# Patient Record
Sex: Female | Born: 1975 | Race: White | Hispanic: No | Marital: Married | Smoking: Never smoker
Health system: Southern US, Community
[De-identification: ages and names within clinical notes are randomized; demographics above are authoritative.]

## PROBLEM LIST (undated history)

## (undated) ENCOUNTER — Inpatient Hospital Stay (HOSPITAL_COMMUNITY): Payer: Self-pay

## (undated) DIAGNOSIS — D649 Anemia, unspecified: Secondary | ICD-10-CM

## (undated) DIAGNOSIS — Z8619 Personal history of other infectious and parasitic diseases: Secondary | ICD-10-CM

## (undated) DIAGNOSIS — Z87448 Personal history of other diseases of urinary system: Secondary | ICD-10-CM

## (undated) DIAGNOSIS — O09529 Supervision of elderly multigravida, unspecified trimester: Secondary | ICD-10-CM

## (undated) DIAGNOSIS — E119 Type 2 diabetes mellitus without complications: Secondary | ICD-10-CM

## (undated) HISTORY — DX: Type 2 diabetes mellitus without complications: E11.9

## (undated) HISTORY — DX: Supervision of elderly multigravida, unspecified trimester: O09.529

## (undated) HISTORY — PX: NO PAST SURGERIES: SHX2092

## (undated) HISTORY — DX: Personal history of other infectious and parasitic diseases: Z86.19

## (undated) HISTORY — DX: Personal history of other diseases of urinary system: Z87.448

## (undated) HISTORY — DX: Anemia, unspecified: D64.9

---

## 2000-10-05 ENCOUNTER — Emergency Department (HOSPITAL_COMMUNITY): Admission: EM | Admit: 2000-10-05 | Discharge: 2000-10-05 | Payer: Self-pay | Admitting: Emergency Medicine

## 2000-10-05 ENCOUNTER — Encounter: Payer: Self-pay | Admitting: Emergency Medicine

## 2000-12-29 ENCOUNTER — Other Ambulatory Visit: Admission: RE | Admit: 2000-12-29 | Discharge: 2000-12-29 | Payer: Self-pay | Admitting: Gynecology

## 2001-03-24 ENCOUNTER — Emergency Department (HOSPITAL_COMMUNITY): Admission: EM | Admit: 2001-03-24 | Discharge: 2001-03-24 | Payer: Self-pay | Admitting: Emergency Medicine

## 2001-09-15 ENCOUNTER — Encounter: Admission: RE | Admit: 2001-09-15 | Discharge: 2001-09-15 | Payer: Self-pay | Admitting: Gynecology

## 2001-10-18 ENCOUNTER — Inpatient Hospital Stay (HOSPITAL_COMMUNITY): Admission: AD | Admit: 2001-10-18 | Discharge: 2001-10-20 | Payer: Self-pay | Admitting: *Deleted

## 2001-11-29 ENCOUNTER — Other Ambulatory Visit: Admission: RE | Admit: 2001-11-29 | Discharge: 2001-11-29 | Payer: Self-pay | Admitting: Gynecology

## 2002-12-29 ENCOUNTER — Other Ambulatory Visit: Admission: RE | Admit: 2002-12-29 | Discharge: 2002-12-29 | Payer: Self-pay | Admitting: Gynecology

## 2004-01-01 ENCOUNTER — Other Ambulatory Visit: Admission: RE | Admit: 2004-01-01 | Discharge: 2004-01-01 | Payer: Self-pay | Admitting: Gynecology

## 2005-01-01 ENCOUNTER — Other Ambulatory Visit: Admission: RE | Admit: 2005-01-01 | Discharge: 2005-01-01 | Payer: Self-pay | Admitting: Gynecology

## 2006-03-09 ENCOUNTER — Other Ambulatory Visit: Admission: RE | Admit: 2006-03-09 | Discharge: 2006-03-09 | Payer: Self-pay | Admitting: Gynecology

## 2006-07-08 ENCOUNTER — Ambulatory Visit: Payer: Self-pay | Admitting: Cardiology

## 2006-07-08 LAB — CONVERTED CEMR LAB
ALT: 18 units/L (ref 0–40)
AST: 17 units/L (ref 0–37)
Albumin: 3.4 g/dL — ABNORMAL LOW (ref 3.5–5.2)
Alkaline Phosphatase: 52 units/L (ref 39–117)
BUN: 8 mg/dL (ref 6–23)
Basophils Absolute: 0.1 10*3/uL (ref 0.0–0.1)
Basophils Relative: 0.8 % (ref 0.0–1.0)
Bilirubin, Direct: 0.1 mg/dL (ref 0.0–0.3)
CO2: 28 meq/L (ref 19–32)
Calcium: 9 mg/dL (ref 8.4–10.5)
Chloride: 106 meq/L (ref 96–112)
Cholesterol: 262 mg/dL (ref 0–200)
Creatinine, Ser: 0.7 mg/dL (ref 0.4–1.2)
Direct LDL: 171.7 mg/dL
Eosinophils Absolute: 0.1 10*3/uL (ref 0.0–0.6)
Eosinophils Relative: 1 % (ref 0.0–5.0)
GFR calc Af Amer: 127 mL/min
GFR calc non Af Amer: 105 mL/min
Glucose, Bld: 98 mg/dL (ref 70–99)
HCT: 34.4 % — ABNORMAL LOW (ref 36.0–46.0)
HDL: 48.9 mg/dL (ref >39.0)
Hemoglobin: 12.2 g/dL (ref 12.0–15.0)
Lymphocytes Relative: 31.1 % (ref 12.0–46.0)
MCHC: 35.5 g/dL (ref 30.0–36.0)
MCV: 83.3 fL (ref 78.0–100.0)
Monocytes Absolute: 0.3 10*3/uL (ref 0.2–0.7)
Monocytes Relative: 4.5 % (ref 3.0–11.0)
Neutro Abs: 4.3 10*3/uL (ref 1.4–7.7)
Neutrophils Relative %: 62.6 % (ref 43.0–77.0)
Platelets: 372 10*3/uL (ref 150–400)
Potassium: 4 meq/L (ref 3.5–5.1)
RBC: 4.13 M/uL (ref 3.87–5.11)
RDW: 11.6 % (ref 11.5–14.6)
Sodium: 140 meq/L (ref 135–145)
TSH: 1.14 microintl units/mL (ref 0.35–5.50)
Total Bilirubin: 0.5 mg/dL (ref 0.3–1.2)
Total CHOL/HDL Ratio: 5.4
Total Protein: 6.7 g/dL (ref 6.0–8.3)
Triglycerides: 236 mg/dL (ref 0–149)
VLDL: 47 mg/dL — ABNORMAL HIGH (ref 0–40)
WBC: 7 10*3/uL (ref 4.5–10.5)

## 2007-03-11 ENCOUNTER — Other Ambulatory Visit: Admission: RE | Admit: 2007-03-11 | Discharge: 2007-03-11 | Payer: Self-pay | Admitting: Gynecology

## 2007-11-30 ENCOUNTER — Ambulatory Visit: Payer: Self-pay | Admitting: Gynecology

## 2008-03-13 ENCOUNTER — Ambulatory Visit: Payer: Self-pay | Admitting: Gynecology

## 2008-03-13 ENCOUNTER — Other Ambulatory Visit: Admission: RE | Admit: 2008-03-13 | Discharge: 2008-03-13 | Payer: Self-pay | Admitting: Gynecology

## 2009-05-08 ENCOUNTER — Other Ambulatory Visit: Admission: RE | Admit: 2009-05-08 | Discharge: 2009-05-08 | Payer: Self-pay | Admitting: Gynecology

## 2009-05-08 ENCOUNTER — Ambulatory Visit: Payer: Self-pay | Admitting: Gynecology

## 2009-12-03 ENCOUNTER — Ambulatory Visit: Payer: Self-pay | Admitting: Gynecology

## 2010-05-09 ENCOUNTER — Emergency Department (HOSPITAL_BASED_OUTPATIENT_CLINIC_OR_DEPARTMENT_OTHER)
Admission: EM | Admit: 2010-05-09 | Discharge: 2010-05-09 | Disposition: A | Discharge status: Home / Self Care | Payer: BC Managed Care – PPO | Attending: Emergency Medicine | Admitting: Emergency Medicine

## 2010-05-09 ENCOUNTER — Emergency Department (INDEPENDENT_AMBULATORY_CARE_PROVIDER_SITE_OTHER): Payer: BC Managed Care – PPO

## 2010-05-09 DIAGNOSIS — R079 Chest pain, unspecified: Secondary | ICD-10-CM

## 2010-05-09 DIAGNOSIS — R071 Chest pain on breathing: Secondary | ICD-10-CM | POA: Insufficient documentation

## 2010-05-09 DIAGNOSIS — E7800 Pure hypercholesterolemia, unspecified: Secondary | ICD-10-CM | POA: Insufficient documentation

## 2010-05-09 LAB — POCT CARDIAC MARKERS
CKMB, poc: <1 ng/mL — ABNORMAL LOW (ref 1.0–8.0)
Myoglobin, poc: 59.8 ng/mL (ref 12–200)
Troponin i, poc: <0.05 ng/mL (ref 0.00–0.09)

## 2010-07-16 ENCOUNTER — Other Ambulatory Visit: Payer: Self-pay | Admitting: Gynecology

## 2010-07-16 ENCOUNTER — Other Ambulatory Visit (HOSPITAL_COMMUNITY)
Admission: RE | Admit: 2010-07-16 | Discharge: 2010-07-16 | Disposition: A | Discharge status: Home / Self Care | Payer: BC Managed Care – PPO | Source: Ambulatory Visit | Attending: Gynecology | Admitting: Gynecology

## 2010-07-16 ENCOUNTER — Encounter (INDEPENDENT_AMBULATORY_CARE_PROVIDER_SITE_OTHER): Payer: BC Managed Care – PPO | Admitting: Gynecology

## 2010-07-16 DIAGNOSIS — R82998 Other abnormal findings in urine: Secondary | ICD-10-CM

## 2010-07-16 DIAGNOSIS — Z13220 Encounter for screening for lipoid disorders: Secondary | ICD-10-CM

## 2010-07-16 DIAGNOSIS — Z833 Family history of diabetes mellitus: Secondary | ICD-10-CM

## 2010-07-16 DIAGNOSIS — Z124 Encounter for screening for malignant neoplasm of cervix: Secondary | ICD-10-CM | POA: Insufficient documentation

## 2010-07-16 DIAGNOSIS — Z01419 Encounter for gynecological examination (general) (routine) without abnormal findings: Secondary | ICD-10-CM

## 2010-08-09 NOTE — Discharge Summary (Signed)
Ashley Lane, Ashley Lane                         ACCOUNT NO.:  [id number]   MEDICAL RECORD NO.:  [id number]                   PATIENT TYPE:  INP   LOCATION:  9121                                 FACILITY:  [facility]   PHYSICIAN:  Devin M. Ciliberti, M.D.            DATE OF BIRTH:  1975-07-07   DATE OF ADMISSION:  10/18/2001  DATE OF DISCHARGE:  10/20/2001                                 DISCHARGE SUMMARY   DISCHARGE DIAGNOSIS:  Intrauterine pregnancy at 39 weeks delivered, status  post vacuum-assisted vaginal delivery.   HISTORY:  This is a 35 year old female gravida 2 para 1 with an EDC of  October 25, 2001.  The patient had a history of macrosomia.   HOSPITAL COURSE:  On October 18, 2001 the patient was admitted at 39 weeks with  favorable cervical dilatation with a history of macrosomia.  Artificial  rupture of membranes was performed and the patient was begun on Pitocin.  M.D. was called to see the patient at 1745 because the patient, after she  had received her epidural catheter, suddenly had an episode when she passed  out, became unresponsive, and then slowly returned to responsiveness.  It  was not observed by the hospital staff but was observed by the patient's  mother and husband, who reported that her eyes seemed to roll back but she  had no gross tonic-clonic seizure activity.  She was evaluated by  anesthesia, nursing staff, as well as M.D., and was unsure of the exact  cause of this episode.  It was thought by the anesthesiologist not to be  epidural related, but he did take the fentanyl out of the epidural and  decrease the epidural dose.  Blood tests were ordered, they were all within  normal limits, and since initial episode happened the patient began to  gradually feel better.  It was doubted by M.D. that a stroke had occurred  since she had no focal findings on physical exam.  The patient was monitored  and symptoms resolved.  On October 18, 2001 at 2153, the patient underwent  a  vacuum-assisted vaginal delivery of a female with Apgars of 9 and 9, weight  8 pounds 10 ounces.  There was no episiotomy, no laceration.  Postpartum the  patient remained afebrile, voiding, in stable condition.  The symptoms she  experienced previously totally resolved.  The patient was discharged is  satisfactory condition on October 20, 2001.   INSTRUCTIONS:  Given [facility] Gynecology instructions and postpartum  booklet.   ACCESSORY LABORATORY DATA:  The patient is A positive, rubella immune.  On  October 19, 2001 hemoglobin 9.1, hematocrit 26.6.   DISPOSITION:  The patient was discharged home.   FOLLOW-UP:  Informed to return to the office in six weeks; if any problems  prior to that time to be seen in the office.     Ashley Lane, P.A.  Devin M. Ciliberti, M.D.    TSG/MEDQ  D:  11/19/2001  T:  11/19/2001  Job:  [id number]

## 2010-08-09 NOTE — Assessment & Plan Note (Signed)
[facility]                            CARDIOLOGY OFFICE NOTE   KADINCE, BOXLEY                      MRN:          [id number]  DATE:07/08/2006                            DOB:          [birth year]    Mrs. Briones is a 35 year old female who was referred for evaluation of  hyperlipidemia.  She has no prior cardiac history.  She does not have  dyspnea on exertion, orthopnea, PND, pedal edema, palpitations, pre-  syncope, syncope, or exertional chest pain.  She recently had  laboratories checked on June 09, 2006.  Her total cholesterol was 298  with a triglyceride level of 224 and an LDL of 200.  Her HDL was 54.  Because of these findings, we were asked to further evaluate.  Note, she  has been taking fish oil since then.  She apparently does not have a  family history of hyperlipidemia by her report.   FAMILY HISTORY:  As above.  No history of hyperlipidemia.  There is no  history of coronary disease.   SOCIAL HISTORY:  She denies any tobacco or alcohol use, and there is no  drug use.   PAST MEDICAL HISTORY:  There is no diabetes mellitus or hypertension,  but she has been diagnosed with hyperlipidemia, as described in the HPI.  There are no previous thyroid problems.  She has not had any prior  cardiac issues, and there are no previous surgeries.   REVIEW OF SYSTEMS:  She denies any headaches, fevers, or chills.  There  is no productive cough or hemoptysis.  There was no recent weight loss  or gain.  There are no night sweats.  She denies any dysphagia,  odynophagia, melena, or hematochezia.  There is no dysuria or hematuria.  There is no rash or seizure activity.  There is no orthopnea, PND, or  pedal edema.  There is no claudication noted.  The remaining systems are  negative.   PHYSICAL EXAM:  Blood pressure 101/79, pulse is 74.  She weighs 218  pounds.  She is well-developed and somewhat obese.  She is in no acute distress.  SKIN:  Warm  and dry.  She does not appear to be depressed.  There is no peripheral clubbing.  BACK:  Normal.  HEENT:  Unremarkable.  Normal eyelids.  NECK:  Supple with a normal upstroke bilaterally, and I cannot  appreciate bruits.  There is no jugular venous distension and no  thyromegaly is noted.  CHEST:  Clear to auscultation with normal expansion.  CARDIOVASCULAR:  Regular rate and rhythm with normal S1, S2.  There are  no murmurs, rubs, or gallops noted.  I cannot palpate her PMI.  ABDOMEN:  Nontender, nondistended.  Positive bowel sounds.  No  hepatosplenomegaly.  No mass appreciated.  There is no abdominal bruit.  She has 2+ femoral pulses bilaterally.  No bruits.  EXTREMITIES:  No edema.  I can palpate no cords.  She has 2+ posterior  tibial pulses bilaterally.  NEUROLOGIC:  Grossly intact.   ELECTROCARDIOGRAM:  Shows a sinus rhythm at a rate of 74.  There  are no  ST changes noted.   DIAGNOSES:  Hyperlipidemia - The patient presents for evaluation of  hyperlipidemia.  We obtained her cholesterol results and her LDL is 200.  She has no other risk factors, including no family history, no diabetes  mellitus, and no hypertension or tobacco use.  However, given the  severity of elevation, this will most likely require Statin therapy.  We  discussed this and she is somewhat hesitant to consider medications at  this point.  Instead, we will check laboratories, since she has been on  fish oil for 1 month.  We will repeat a fasting lipid panel, and we will  also check a TSH to make sure that hypothyroidism is not contributing to  her hyperlipidemia.  We will also check liver function and a CBC.  We  will have her return in approximately 4 to 6 weeks.  I think if her LDL  remains in the 200 range, she would benefit from a Statin long term and  we will discuss this issue.  She also will need to be educated on diet.  She will continue on her multivitamin  and omega 3 fish oil for now, but  I  have asked her to discontinue her garlic.     Madolyn Frieze Jens Som, MD, [facility]  Electronically Signed    BSC/MedQ  DD: 07/08/2006  DT: 07/08/2006  Job #: [id number]   cc:   Marcial Pacas P. Fontaine, M.D.

## 2010-08-09 NOTE — Consult Note (Signed)
NAMEPERLITA, Ashley Lane                         ACCOUNT NO.:  [id number]   MEDICAL RECORD NO.:  [id number]                   PATIENT TYPE:  INP   LOCATION:  9121                                 FACILITY:  [facility]   PHYSICIAN:  Timothy P. Fontaine, M.D.           DATE OF BIRTH:  10/26/1975   DATE OF CONSULTATION:  DATE OF DISCHARGE:                                OB/GYN CONSULTATION   PROGRESS NOTE:  Called to see patient.  Had received her epidural catheter and subsequently  had an episode where she passed out, became unresponsive, and then slowly  has returned as far as responsiveness is concerned.  This was unobserved by  hospital staff but was observed by the patient's mother and husband.  They  reported that her eyes seemed to roll back in her head, but she had no gross  tonic-clonic or other seizure-like activity.  The short evaluation  subsequently by nursing and then subsequent to that by anesthesia showed the  patient was arousable although somewhat somnolent.  Her blood pressure had  been normal throughout all of this.  She did not seem to have a major  hypotensive episode associated with this.  The fetal tracing remained  reassuring throughout, and her contraction pattern had remained unchanged,  contracting every several minutes with  low adequate pattern.   Upon my notification, I subsequently came to evaluate the patient.  She was  awake; she was oriented.  She did report feeling very drained or tired.  She  denied any visual changes.  She was able to read.  No nausea or vomiting, no  headaches or other symptomatology.  Her physical exam with a rough  neurologic exam to include eyes,  extraocular muscles intact.  Pupils were  reactive to accommodation and light.  Tongue was midline and forward.  She  shoulder shrug was symmetrical.  Upper muscle movement was intact.  Upper  muscle sensation was intact.  Her reflexes were 1+, although she did have  decreased muscle strength  in finger grip bilaterally which was symmetrical.  Her lower extremity exam as hampered by her epidural in that she had limited  movement of both legs following epidural placement.  She did have some rough  sensation to scratching over both lower extremities but was unable to move  them grossly.  She could wiggle both toes and had depressed reflexes without  clonus in both lower extremities.  Her cervical exam revealed cervix to be 6  cm, -1 to -2, 80% ,and her contraction pattern was low adequate with  contractions every 2 to 3 minutes with a reactive fetal tracing.   Discussed with the patient and her family somewhat of a confusing picture.  The anesthesiologist who saw her initially did not feel that this was  epidural related.  He did take the fentanyl out of the epidural, and he did  decrease the epidural dose.  The patient notes since  the admission episode  she is feeling better.  She was numb up to her xiphoid by her history, and  this seems to be resolving where now she can feel her upper stomach.  Again,  she is feeling better and stronger as time goes on.  I did order a  comprehensive metabolic panel to check electrolytes, glucose, as well as a  CBC for platelets and a PT/PTT.  Discussed various differentials to include  epidural related, although again the anesthesiologist does not feel this, as  well as after placement of the epidural whether or not she had significant  pain relief, and this led to a heavy sleep scenario, although again this  does not quite fit the picture.  The patient did not have any gross seizure  activity, has no history of seizures in the past, and the question is if  this would be some seizure variant.  I doubt a more ominous neurologic such  as stroke as she has no focal findings at all and no other symptoms such as  hypertension or hypotension.   Will plan on the blood work first.  Will monitor her symptomatology, if with  decreasing epidural load,  these symptoms slowly resolve,and will follow  expectantly.  If there is any question, will have neurologist see her up to  and including CNS study per their recommendation.  The patient and family  are comfortable with this plan.                                               Timothy P. Audie Box, M.D.    TPF/MEDQ  D:  10/18/2001  T:  10/23/2001  Job:  [id number]

## 2011-07-21 ENCOUNTER — Encounter: Payer: Self-pay | Admitting: Gynecology

## 2011-07-21 ENCOUNTER — Ambulatory Visit (INDEPENDENT_AMBULATORY_CARE_PROVIDER_SITE_OTHER): Payer: BC Managed Care – PPO | Admitting: Gynecology

## 2011-07-21 ENCOUNTER — Encounter: Payer: Self-pay | Admitting: *Deleted

## 2011-07-21 DIAGNOSIS — N912 Amenorrhea, unspecified: Secondary | ICD-10-CM

## 2011-07-21 NOTE — Patient Instructions (Signed)
Follow up for ultrasound. 

## 2011-07-21 NOTE — Progress Notes (Signed)
Patient presents with LMP 06/18/2011. Had been using rhythm method for contraception. UPT here is positive.  Exam with Amy chaperone present Abdomen soft nontender without masses guarding rebound organomegaly. Pelvic external BUS vagina normal. Cervix normal. Uterus soft mildly enlarged midline mobile nontender. Adnexa without masses or tenderness.  Assessment and plan: Pregnancy at 4 weeks-5 weeks with [facility] 03/23/2012. She is having some mild pelvic cramping which I think is normal but we'll go ahead and get baseline ultrasound. Ectopic precautions reviewed. She understands we do not do obstetrics and she will make an appointment to see an obstetrician in town over the next 4 weeks. She Will start on a prenatal vitamin. She has been on a multivitamin with folic acid.

## 2011-07-28 ENCOUNTER — Ambulatory Visit (INDEPENDENT_AMBULATORY_CARE_PROVIDER_SITE_OTHER): Payer: BC Managed Care – PPO | Admitting: Gynecology

## 2011-07-28 ENCOUNTER — Ambulatory Visit (INDEPENDENT_AMBULATORY_CARE_PROVIDER_SITE_OTHER): Payer: BC Managed Care – PPO

## 2011-07-28 ENCOUNTER — Encounter: Payer: Self-pay | Admitting: Gynecology

## 2011-07-28 DIAGNOSIS — N912 Amenorrhea, unspecified: Secondary | ICD-10-CM

## 2011-07-28 LAB — US OB TRANSVAGINAL

## 2011-07-28 NOTE — Progress Notes (Signed)
Patient follows up for ultrasound which confirms a viable IUP at 5 weeks 6 days consistent with her LMP. Reviewed this with her and her husband. She is in the process of arranging follow up with an obstetrician in town. I gave her a copy of the ultrasound report. She is on a prenatal vitamin and without issues.

## 2011-07-28 NOTE — Patient Instructions (Signed)
Make an appointment over the next 4 weeks to see an obstetrician for continued obstetrical care.

## 2011-08-04 ENCOUNTER — Telehealth: Payer: Self-pay | Admitting: *Deleted

## 2011-08-04 NOTE — Telephone Encounter (Signed)
Pt informed with the below note. 

## 2011-08-04 NOTE — Telephone Encounter (Signed)
Left the below note on pt voicemail. 

## 2011-08-04 NOTE — Telephone Encounter (Signed)
Pt was seen in office for ultrasound which confirms a viable IUP at 5 weeks 6 days. Pt received copy of ultrasound and stated it said that she has a cyst on her right ovary and that her right ovary in larger than her left ovary. Pt said she didn't know of this information and is concerned? Please advise

## 2011-08-04 NOTE — Telephone Encounter (Signed)
That is the corpus luteal cyst which is where she ovulated from and became pregnant from him. It's normal to have a cyst on one ovary that is supporting the early pregnancy.

## 2011-08-19 LAB — OB RESULTS CONSOLE RUBELLA ANTIBODY, IGM: Rubella: IMMUNE

## 2011-08-19 LAB — OB RESULTS CONSOLE ABO/RH: RH Type: POSITIVE

## 2011-08-19 LAB — OB RESULTS CONSOLE HEPATITIS B SURFACE ANTIGEN: Hepatitis B Surface Ag: NEGATIVE

## 2012-02-02 ENCOUNTER — Observation Stay (HOSPITAL_COMMUNITY)
Admission: AD | Admit: 2012-02-02 | Discharge: 2012-02-04 | Disposition: A | Discharge status: Home / Self Care | Payer: BC Managed Care – PPO | Source: Ambulatory Visit | Attending: Obstetrics and Gynecology | Admitting: Obstetrics and Gynecology

## 2012-02-02 ENCOUNTER — Encounter (HOSPITAL_COMMUNITY): Payer: Self-pay | Admitting: *Deleted

## 2012-02-02 DIAGNOSIS — O4590 Premature separation of placenta, unspecified, unspecified trimester: Principal | ICD-10-CM | POA: Insufficient documentation

## 2012-02-02 DIAGNOSIS — O4690 Antepartum hemorrhage, unspecified, unspecified trimester: Secondary | ICD-10-CM

## 2012-02-02 LAB — URINE MICROSCOPIC-ADD ON

## 2012-02-02 LAB — URINALYSIS, ROUTINE W REFLEX MICROSCOPIC
Glucose, UA: 250 mg/dL — AB
pH: 6.5 (ref 5.0–8.0)

## 2012-02-02 LAB — AMNISURE RUPTURE OF MEMBRANE (ROM) NOT AT ARMC: Amnisure ROM: NEGATIVE

## 2012-02-02 NOTE — MAU Provider Note (Signed)
  History     CSN: [id number]  Arrival date and time: 02/02/12 2101   First Provider Initiated Contact with Patient 02/02/12 2220      Chief Complaint  Patient presents with  . Vaginal Bleeding   HPI  Ashley Lane is a 36 y.o. G3P2. She presents today with vaginal bleeding. She last had intercourse 48 hours ago. Today at dinner she felt a small trickle and noticed it was bright red blood. She continued to have small drops of blood. It has slowed since coming here. +FM. She states she has been having BH contractions off and on today.  Past Medical History  Diagnosis Date  . Gestational diabetes     Past Surgical History  Procedure Date  . No past surgeries     Family History  Problem Relation Age of Onset  . Hypertension Mother   . Cancer Sister     cervical  . Hypertension Maternal Grandmother   . Hypertension Maternal Grandfather     History  Substance Use Topics  . Smoking status: Never Smoker   . Smokeless tobacco: Never Used  . Alcohol Use: No    Allergies:  Allergies  Allergen Reactions  . Latex     Prescriptions prior to admission  Medication Sig Dispense Refill  . EVENING PRIMROSE OIL PO Take by mouth.      . fish oil-omega-3 fatty acids 1000 MG capsule Take 2 g by mouth daily.      . Multiple Vitamin (MULTIVITAMIN) tablet Take 1 tablet by mouth daily.        Review of Systems  Constitutional: Negative for fever.  Eyes: Negative for blurred vision.  Respiratory: Negative for shortness of breath.   Gastrointestinal: Negative for nausea, vomiting, diarrhea and constipation.  Genitourinary: Negative for dysuria, urgency and frequency.  Neurological: Negative for headaches.   Physical Exam   Blood pressure 123/71, pulse 100, temperature 98.2 F (36.8 C), temperature source Oral, resp. rate 18, last menstrual period 06/18/2011.  Physical Exam  Nursing note and vitals reviewed. Constitutional: She appears well-developed and  well-nourished.  Cardiovascular: Normal rate.   Respiratory: Effort normal.  GI: Soft. There is no tenderness. There is no rebound and no guarding.  Genitourinary:        External: normal Vagina: pooling of blood in vaginal vault Cervical exam deferred pending amnisure Uterus: AGA  Skin: Skin is warm and dry.  Psychiatric: She has a normal mood and affect.   Cervix: 1/50/-3  MAU Course  Procedures  2230: Spoke with Dr. Renaldo Fiddler, plan to admit patient overnight.   Assessment and Plan  Third trimester bleeding Admit to antenatal Care turned over to Dr. Renaldo Fiddler.   Tawnya Crook 02/02/2012, 10:37 PM

## 2012-02-02 NOTE — MAU Note (Signed)
Out to eat tonight noticed sharp pain in vaginal area around 8:00. When patient arrived home, noticed dime sized area in underwear. With wiping noticed red blood on toilet tissue.  Patient also describes increased pressure that feels "like I am going to wet myself".

## 2012-02-03 ENCOUNTER — Inpatient Hospital Stay (HOSPITAL_COMMUNITY): Payer: BC Managed Care – PPO

## 2012-02-03 ENCOUNTER — Encounter (HOSPITAL_COMMUNITY): Payer: Self-pay | Admitting: *Deleted

## 2012-02-03 LAB — CBC
MCH: 30.1 pg (ref 26.0–34.0)
MCHC: 34.1 g/dL (ref 30.0–36.0)
Platelets: 224 10*3/uL (ref 150–400)
RDW: 13.8 % (ref 11.5–15.5)

## 2012-02-03 LAB — ABO/RH: ABO/RH(D): A POS

## 2012-02-03 LAB — RPR: RPR Ser Ql: NONREACTIVE

## 2012-02-03 LAB — TYPE AND SCREEN: ABO/RH(D): A POS

## 2012-02-03 LAB — OB RESULTS CONSOLE ABO/RH

## 2012-02-03 MED ORDER — ZOLPIDEM TARTRATE 5 MG PO TABS: 5.0000 mg | ORAL_TABLET | Freq: Every evening | ORAL | Status: DC | PRN

## 2012-02-03 MED ORDER — SODIUM CHLORIDE 0.9 % IJ SOLN
3.0000 mL | Freq: Two times a day (BID) | INTRAMUSCULAR | Status: DC
  Administered 2012-02-03 (×2): 3 mL via INTRAVENOUS

## 2012-02-03 MED ORDER — PRENATAL MULTIVITAMIN CH
1.0000 | ORAL_TABLET | Freq: Every day | ORAL | Status: DC
  Administered 2012-02-03: 1 via ORAL
  Filled 2012-02-03: qty 1

## 2012-02-03 MED ORDER — ACETAMINOPHEN 325 MG PO TABS: 650.0000 mg | ORAL_TABLET | ORAL | Status: DC | PRN

## 2012-02-03 MED ORDER — DOCUSATE SODIUM 100 MG PO CAPS
100.0000 mg | ORAL_CAPSULE | Freq: Every day | ORAL | Status: DC
Start: 2012-02-03 — End: 2012-02-04
  Administered 2012-02-03: 100 mg via ORAL
  Filled 2012-02-03: qty 1

## 2012-02-03 MED ORDER — CALCIUM CARBONATE ANTACID 500 MG PO CHEW: 2.0000 | CHEWABLE_TABLET | ORAL | Status: DC | PRN

## 2012-02-03 MED ORDER — BETAMETHASONE SOD PHOS & ACET 6 (3-3) MG/ML IJ SUSP
12.0000 mg | Freq: Every day | INTRAMUSCULAR | Status: AC
  Administered 2012-02-03 – 2012-02-04 (×2): 12 mg via INTRAMUSCULAR
  Filled 2012-02-03 (×2): qty 2

## 2012-02-03 MED ORDER — SODIUM CHLORIDE 0.9 % IJ SOLN
INTRAMUSCULAR | Status: AC
  Filled 2012-02-03: qty 3

## 2012-02-03 NOTE — Progress Notes (Signed)
UR Chart review completed.  

## 2012-02-03 NOTE — Progress Notes (Signed)
Light brown discharge with wiping today No abdominal pain, HA, epigastric pain, trauma  VSS Afeb Uterus soft, NT FHT reactive UCs none  U/S Breech, EFW 5# 12oz, normal AFI, no previa or U/S evidence of abruption  A: Painless vaginal Bleeding-possible marginal abruption  P: Observation      Fetal Monitors      Betamethasone

## 2012-02-03 NOTE — H&P (Signed)
Chief Complaint   Patient presents with   .  Vaginal Bleeding    HPI  Ashley Lane is a 36 y.o. G3P2. She presents today with vaginal bleeding. She last had intercourse 48 hours ago. Today at dinner she felt a small trickle and noticed it was bright red blood. She continued to have small drops of blood. It has slowed since coming here. +FM. She states she has been having BH contractions off and on today.  Past Medical History   Diagnosis  Date   .  Gestational diabetes     Past Surgical History   Procedure  Date   .  No past surgeries     Family History   Problem  Relation  Age of Onset   .  Hypertension  Mother    .  Cancer  Sister       cervical    .  Hypertension  Maternal Grandmother    .  Hypertension  Maternal Grandfather     History   Substance Use Topics   .  Smoking status:  Never Smoker   .  Smokeless tobacco:  Never Used   .  Alcohol Use:  No    Allergies:  Allergies   Allergen  Reactions   .  Latex     Prescriptions prior to admission   Medication  Sig  Dispense  Refill   .  EVENING PRIMROSE OIL PO  Take by mouth.     .  fish oil-omega-3 fatty acids 1000 MG capsule  Take 2 g by mouth daily.     .  Multiple Vitamin (MULTIVITAMIN) tablet  Take 1 tablet by mouth daily.      Review of Systems  Constitutional: Negative for fever.  Eyes: Negative for blurred vision.  Respiratory: Negative for shortness of breath.  Gastrointestinal: Negative for nausea, vomiting, diarrhea and constipation.  Genitourinary: Negative for dysuria, urgency and frequency.  Neurological: Negative for headaches.   Physical Exam   Blood pressure 123/71, pulse 100, temperature 98.2 F (36.8 C), temperature source Oral, resp. rate 18, last menstrual period 06/18/2011.  Physical Exam  Nursing note and vitals reviewed.  Constitutional: She appears well-developed and well-nourished.  Cardiovascular: Normal rate.  Respiratory: Effort normal.  GI: Soft. There is no tenderness.  There is no rebound and no guarding.  Genitourinary:   External: normal Vagina: pooling of blood in vaginal vault Cervical exam deferred pending amnisure Uterus: AGA  Skin: Skin is warm and dry.  Psychiatric: She has a normal mood and affect.   Cervix: 1/50/-3  MAU Course   Procedures  2230: Spoke with Dr. Renaldo Fiddler, plan to admit patient overnight.  Assessment and Plan    Third trimester bleeding Admit CBC Ultrasound

## 2012-02-03 NOTE — MAU Note (Signed)
TO US VIA W/C.

## 2012-02-04 NOTE — Progress Notes (Signed)
Patient has not had any bleeding since admission. Reports good fetal movement. Denies contractions. Afebrile Vital signs stable General alert and oriented Abdomen is soft and non tender Fetal heart rate is reactive  IMPRESSION: IUP at 33 w 4 days Possible small marginal abruption  PLAN: Patient is very clinically stable Discharge home  Modified bedrest  Strict precautions

## 2012-02-04 NOTE — Discharge Summary (Signed)
Admission Diagnosis: IUP at 33 weeks Vaginal bleeding  Discharge diagnosis: Same Possible marginal placental abruption  Hospital course: 36 year old female admitted with painless vaginal bleeding. She was placed in observation. During her hospital stay, she did not experience any vaginal bleeding. She was given steroid series. Ultrasound was completely normal. She was discharged home in good condition. She will be on modified bedrest. She will return to office next Monday for office visit and ultrasound.

## 2012-03-04 LAB — OB RESULTS CONSOLE GBS: GBS: NEGATIVE

## 2012-03-18 ENCOUNTER — Encounter (HOSPITAL_COMMUNITY): Payer: Self-pay | Admitting: *Deleted

## 2012-03-18 ENCOUNTER — Telehealth (HOSPITAL_COMMUNITY): Payer: Self-pay | Admitting: *Deleted

## 2012-03-18 NOTE — Telephone Encounter (Signed)
Preadmission screen  

## 2012-03-19 ENCOUNTER — Encounter (HOSPITAL_COMMUNITY): Payer: Self-pay

## 2012-03-19 ENCOUNTER — Inpatient Hospital Stay (HOSPITAL_COMMUNITY): Payer: BC Managed Care – PPO | Admitting: Anesthesiology

## 2012-03-19 ENCOUNTER — Inpatient Hospital Stay (HOSPITAL_COMMUNITY)
Admission: RE | Admit: 2012-03-19 | Discharge: 2012-03-21 | DRG: 372 | Disposition: A | Discharge status: Home / Self Care | Payer: BC Managed Care – PPO | Source: Ambulatory Visit | Attending: Obstetrics & Gynecology | Admitting: Obstetrics & Gynecology

## 2012-03-19 ENCOUNTER — Encounter (HOSPITAL_COMMUNITY): Payer: Self-pay | Admitting: Anesthesiology

## 2012-03-19 DIAGNOSIS — O09529 Supervision of elderly multigravida, unspecified trimester: Secondary | ICD-10-CM | POA: Diagnosis present

## 2012-03-19 DIAGNOSIS — O99814 Abnormal glucose complicating childbirth: Principal | ICD-10-CM | POA: Diagnosis present

## 2012-03-19 LAB — CBC
HCT: 32.1 % — ABNORMAL LOW (ref 36.0–46.0)
Hemoglobin: 10.9 g/dL — ABNORMAL LOW (ref 12.0–15.0)
MCH: 29.6 pg (ref 26.0–34.0)
RBC: 3.68 MIL/uL — ABNORMAL LOW (ref 3.87–5.11)

## 2012-03-19 LAB — GLUCOSE, CAPILLARY
Glucose-Capillary: 125 mg/dL — ABNORMAL HIGH (ref 70–99)
Glucose-Capillary: 80 mg/dL (ref 70–99)

## 2012-03-19 LAB — TYPE AND SCREEN: ABO/RH(D): A POS

## 2012-03-19 LAB — RPR: RPR Ser Ql: NONREACTIVE

## 2012-03-19 MED ORDER — OXYCODONE-ACETAMINOPHEN 5-325 MG PO TABS: 1.0000 | ORAL_TABLET | ORAL | Status: DC | PRN

## 2012-03-19 MED ORDER — EPHEDRINE 5 MG/ML INJ
10.0000 mg | INTRAVENOUS | Status: DC | PRN
  Filled 2012-03-19 (×2): qty 4

## 2012-03-19 MED ORDER — ONDANSETRON HCL 4 MG/2ML IJ SOLN: 4.0000 mg | Freq: Four times a day (QID) | INTRAMUSCULAR | Status: DC | PRN

## 2012-03-19 MED ORDER — ZOLPIDEM TARTRATE 5 MG PO TABS: 5.0000 mg | ORAL_TABLET | Freq: Every evening | ORAL | Status: DC | PRN

## 2012-03-19 MED ORDER — ACETAMINOPHEN 325 MG PO TABS: 650.0000 mg | ORAL_TABLET | ORAL | Status: DC | PRN

## 2012-03-19 MED ORDER — ONDANSETRON HCL 4 MG/2ML IJ SOLN: 4.0000 mg | INTRAMUSCULAR | Status: DC | PRN

## 2012-03-19 MED ORDER — PHENYLEPHRINE 40 MCG/ML (10ML) SYRINGE FOR IV PUSH (FOR BLOOD PRESSURE SUPPORT)
80.0000 ug | PREFILLED_SYRINGE | INTRAVENOUS | Status: DC | PRN
  Administered 2012-03-19: 120 ug via INTRAVENOUS
  Administered 2012-03-19: 80 ug via INTRAVENOUS
  Filled 2012-03-19 (×2): qty 5

## 2012-03-19 MED ORDER — OXYTOCIN 40 UNITS IN LACTATED RINGERS INFUSION - SIMPLE MED
1.0000 m[IU]/min | INTRAVENOUS | Status: DC
  Administered 2012-03-19: 2 m[IU]/min via INTRAVENOUS
  Filled 2012-03-19: qty 1000

## 2012-03-19 MED ORDER — BENZOCAINE-MENTHOL 20-0.5 % EX AERO
1.0000 "application " | INHALATION_SPRAY | CUTANEOUS | Status: DC | PRN
  Administered 2012-03-19: 1 via TOPICAL
  Filled 2012-03-19: qty 56

## 2012-03-19 MED ORDER — IBUPROFEN 600 MG PO TABS
600.0000 mg | ORAL_TABLET | Freq: Four times a day (QID) | ORAL | Status: DC | PRN
  Administered 2012-03-19: 600 mg via ORAL
  Filled 2012-03-19: qty 1

## 2012-03-19 MED ORDER — OXYTOCIN BOLUS FROM INFUSION
500.0000 mL | INTRAVENOUS | Status: DC
  Administered 2012-03-19: 500 mL via INTRAVENOUS

## 2012-03-19 MED ORDER — PHENYLEPHRINE 40 MCG/ML (10ML) SYRINGE FOR IV PUSH (FOR BLOOD PRESSURE SUPPORT): 80.0000 ug | PREFILLED_SYRINGE | INTRAVENOUS | Status: DC | PRN

## 2012-03-19 MED ORDER — FENTANYL 2.5 MCG/ML BUPIVACAINE 1/10 % EPIDURAL INFUSION (WH - ANES)
14.0000 mL/h | INTRAMUSCULAR | Status: DC
  Administered 2012-03-19: 14 mL/h via EPIDURAL
  Filled 2012-03-19: qty 125

## 2012-03-19 MED ORDER — SODIUM BICARBONATE 8.4 % IV SOLN
INTRAVENOUS | Status: DC | PRN
  Administered 2012-03-19: 5 mL via EPIDURAL

## 2012-03-19 MED ORDER — TERBUTALINE SULFATE 1 MG/ML IJ SOLN: 0.2500 mg | Freq: Once | INTRAMUSCULAR | Status: DC | PRN

## 2012-03-19 MED ORDER — FLEET ENEMA 7-19 GM/118ML RE ENEM: 1.0000 | ENEMA | RECTAL | Status: DC | PRN

## 2012-03-19 MED ORDER — TETANUS-DIPHTH-ACELL PERTUSSIS 5-2.5-18.5 LF-MCG/0.5 IM SUSP: 0.5000 mL | Freq: Once | INTRAMUSCULAR | Status: DC

## 2012-03-19 MED ORDER — LIDOCAINE HCL (PF) 1 % IJ SOLN: 30.0000 mL | INTRAMUSCULAR | Status: DC | PRN

## 2012-03-19 MED ORDER — LACTATED RINGERS IV SOLN
500.0000 mL | Freq: Once | INTRAVENOUS | Status: AC
  Administered 2012-03-19: 500 mL via INTRAVENOUS

## 2012-03-19 MED ORDER — DIPHENHYDRAMINE HCL 25 MG PO CAPS: 25.0000 mg | ORAL_CAPSULE | Freq: Four times a day (QID) | ORAL | Status: DC | PRN

## 2012-03-19 MED ORDER — OXYTOCIN 40 UNITS IN LACTATED RINGERS INFUSION - SIMPLE MED: 62.5000 mL/h | INTRAVENOUS | Status: DC

## 2012-03-19 MED ORDER — SENNOSIDES-DOCUSATE SODIUM 8.6-50 MG PO TABS
2.0000 | ORAL_TABLET | Freq: Every day | ORAL | Status: DC
  Administered 2012-03-19: 2 via ORAL

## 2012-03-19 MED ORDER — DIBUCAINE 1 % RE OINT: 1.0000 "application " | TOPICAL_OINTMENT | RECTAL | Status: DC | PRN

## 2012-03-19 MED ORDER — PRENATAL MULTIVITAMIN CH
1.0000 | ORAL_TABLET | Freq: Every day | ORAL | Status: DC
  Administered 2012-03-20 – 2012-03-21 (×2): 1 via ORAL
  Filled 2012-03-19 (×2): qty 1

## 2012-03-19 MED ORDER — CITRIC ACID-SODIUM CITRATE 334-500 MG/5ML PO SOLN: 30.0000 mL | ORAL | Status: DC | PRN

## 2012-03-19 MED ORDER — SIMETHICONE 80 MG PO CHEW: 80.0000 mg | CHEWABLE_TABLET | ORAL | Status: DC | PRN

## 2012-03-19 MED ORDER — EPHEDRINE 5 MG/ML INJ: 10.0000 mg | INTRAVENOUS | Status: DC | PRN

## 2012-03-19 MED ORDER — LACTATED RINGERS IV SOLN
INTRAVENOUS | Status: DC
  Administered 2012-03-19: 1000 mL via INTRAVENOUS

## 2012-03-19 MED ORDER — ONDANSETRON HCL 4 MG PO TABS: 4.0000 mg | ORAL_TABLET | ORAL | Status: DC | PRN

## 2012-03-19 MED ORDER — IBUPROFEN 600 MG PO TABS
600.0000 mg | ORAL_TABLET | Freq: Four times a day (QID) | ORAL | Status: DC
  Administered 2012-03-19 – 2012-03-21 (×6): 600 mg via ORAL
  Filled 2012-03-19 (×5): qty 1

## 2012-03-19 MED ORDER — WITCH HAZEL-GLYCERIN EX PADS: 1.0000 "application " | MEDICATED_PAD | CUTANEOUS | Status: DC | PRN

## 2012-03-19 MED ORDER — DIPHENHYDRAMINE HCL 50 MG/ML IJ SOLN: 12.5000 mg | INTRAMUSCULAR | Status: DC | PRN

## 2012-03-19 MED ORDER — LANOLIN HYDROUS EX OINT: TOPICAL_OINTMENT | CUTANEOUS | Status: DC | PRN

## 2012-03-19 MED ORDER — LACTATED RINGERS IV SOLN
500.0000 mL | INTRAVENOUS | Status: DC | PRN
  Administered 2012-03-19: 500 mL via INTRAVENOUS

## 2012-03-19 NOTE — Anesthesia Procedure Notes (Signed)

## 2012-03-19 NOTE — Op Note (Signed)
SVD of VFI; APGARs 9,9, cord pH pending. Head delivered ROA followed atraumatically by body.  Mouth and nose bulb suctioned.  Cord clamped, cut, and baby to abdomen.  Cord pH obtained.  Placenta delivered S/I/3VC.  Fundus firmed with pitocin and massage.  Perineum without laceration.  Mom and baby stable.    Mitchel Honour, DO

## 2012-03-19 NOTE — H&P (Signed)
Ashley Lane is a 36 y.o. female presenting for induction of labor with A1DM. Maternal Medical History:  Contractions: Onset was more than 2 days ago.   Frequency: irregular.   Perceived severity is mild.    Fetal activity: Perceived fetal activity is normal.   Last perceived fetal movement was within the past hour.    Prenatal complications: no prenatal complications Prenatal Complications - Diabetes: gestational. Diabetes is managed by diet.      OB History    Grav Para Term Preterm Abortions TAB SAB Ect Mult Living   3 2 2  0 0 0 0 0 0 2     Past Medical History  Diagnosis Date  . H/O varicella   . AMA (advanced maternal age) multigravida 35+   . Hx of pyelonephritis   . Anemia     hx   Past Surgical History  Procedure Date  . No past surgeries    Family History: family history includes Cancer in her sister; Hypertension in her maternal grandfather, maternal grandmother, and mother; and Thyroid disease in her maternal grandmother and mother. Social History:  reports that she has never smoked. She has never used smokeless tobacco. She reports that she does not drink alcohol or use illicit drugs.   Prenatal Transfer Tool  Maternal Diabetes: Yes:  Diabetes Type:  Diet controlled Genetic Screening: Normal Maternal Ultrasounds/Referrals: Normal Fetal Ultrasounds or other Referrals:  None Maternal Substance Abuse:  No Significant Maternal Medications:  None Significant Maternal Lab Results:  None Other Comments:  None  ROS  Dilation: 4 Effacement (%): 30 Station: -3 Exam by:: Dr Langston Masker Blood pressure 125/73, pulse 89, last menstrual period 06/18/2011. Maternal Exam:  Uterine Assessment: Contraction strength is mild.  Contraction frequency is rare.   Abdomen: Patient reports no abdominal tenderness. Fundal height is c/w dates.   Estimated fetal weight is 7#12.   Fetal presentation: vertex  Introitus: Normal vulva. Normal vagina.  Ferning test: not done.    Nitrazine test: not done. Amniotic fluid character: not assessed.  Pelvis: adequate for delivery.   Cervix: Cervix evaluated by digital exam.     Physical Exam  Constitutional: She is oriented to person, place, and time. She appears well-developed and well-nourished.  HENT:  Head: Normocephalic and atraumatic.  Neck: Normal range of motion. Neck supple.  GI: Soft.  Genitourinary: Vagina normal and uterus normal.  Musculoskeletal: Normal range of motion.  Neurological: She is alert and oriented to person, place, and time.  Skin: Skin is warm and dry.  Psychiatric: She has a normal mood and affect. Her behavior is normal.    Prenatal labs: ABO, Rh: --/--/A POS, A POS (11/12 0032) Antibody: NEG (11/12 0032) Rubella: Immune (05/28 0000) RPR: NON REACTIVE (11/12 0032)  HBsAg: Negative (05/28 0000)  HIV: Non-reactive (05/28 0000)  GBS: Negative (12/12 0000)   Assessment/Plan: 36yo G3P2002 at [redacted]w[redacted]d for IOL -Counseled for pitocin; will start -GBS neg -A1DM: will check fsbs q 2 hours   Mabeline Varas 03/19/2012, 7:52 AM

## 2012-03-19 NOTE — Progress Notes (Signed)
Leetta Jailah Willis is a 36 y.o. G3P2002 at [redacted]w[redacted]d by ultrasound admitted for induction of labor due to Gestational diabetes.  Subjective: Feeling crampy lower abdominal pain.    Objective: BP 115/74  Pulse 85  Temp 98.5 F (36.9 C) (Oral)  Resp 20  Ht 5\' 6"  (1.676 m)  Wt 240 lb (108.863 kg)  BMI 38.74 kg/m2  LMP 06/18/2011      FHT:  FHR: 140 bpm, variability: moderate,  accelerations:  Present,  decelerations:  Absent UC:   regular, every 3-4 minutes SVE:   Dilation: 4 Effacement (%): 30 Station: -3 Exam by:: Dr Langston Masker AROM with copious clear fluid  Labs: Lab Results  Component Value Date   WBC 9.2 03/19/2012   HGB 10.9* 03/19/2012   HCT 32.1* 03/19/2012   MCV 87.2 03/19/2012   PLT 223 03/19/2012    Assessment / Plan: Induction of labor due to gestational diabetes,  progressing well on pitocin  Labor: Progressing normally Preeclampsia:  n/a Fetal Wellbeing:  Category I Pain Control:  Labor support without medications I/D:  n/a Anticipated MOD:  NSVD  Rodolfo Gaster 03/19/2012, 9:50 AM

## 2012-03-19 NOTE — Anesthesia Preprocedure Evaluation (Signed)

## 2012-03-20 LAB — CBC
MCH: 29.8 pg (ref 26.0–34.0)
MCV: 88.3 fL (ref 78.0–100.0)
Platelets: 170 10*3/uL (ref 150–400)
RDW: 13.7 % (ref 11.5–15.5)
WBC: 10.4 10*3/uL (ref 4.0–10.5)

## 2012-03-20 NOTE — Anesthesia Postprocedure Evaluation (Signed)
  Anesthesia Post-op Note  Patient: Ashley Lane  Procedure(s) Performed: * No procedures listed *  Patient Location: PACU and Mother/Baby  Anesthesia Type:Epidural  Level of Consciousness: awake, alert  and oriented  Airway and Oxygen Therapy: Patient Spontanous Breathing  Post-op Pain: none  Post-op Assessment: Post-op Vital signs reviewed, Patient's Cardiovascular Status Stable, No headache, No backache, No residual numbness and No residual motor weakness  Post-op Vital Signs: Reviewed and stable  Complications: No apparent anesthesia complications  Anesthesia Post-op Note  Patient: Ashley Lane  Procedure(s) Performed: * No procedures listed *  Patient Location: PACU and Mother/Baby  Anesthesia Type:Epidural  Level of Consciousness: awake, alert  and oriented  Airway and Oxygen Therapy: Patient Spontanous Breathing  Post-op Pain:mild  Post-op Assessment: Post-op Vital signs reviewed, Patient's Cardiovascular Status Stable, No headache, No backache, No residual numbness and No residual motor weakness  Post-op Vital Signs: Reviewed and stable  Complications: No apparent anesthesia complications

## 2012-03-20 NOTE — Progress Notes (Signed)
Post Partum Day 1 Subjective: no complaints, up ad lib, voiding and tolerating PO  Objective: Blood pressure 97/60, pulse 80, temperature 97.7 F (36.5 C), temperature source Oral, resp. rate 20, height 5\' 6"  (1.676 m), weight 240 lb (108.863 kg), last menstrual period 06/18/2011, SpO2 100.00%, unknown if currently breastfeeding.  Physical Exam:  General: alert, cooperative and appears stated age Lochia: appropriate Uterine Fundus: firm Incision: n/a  DVT Evaluation: No evidence of DVT seen on physical exam. Negative Homan's sign. No cords or calf tenderness.   Basename 03/20/12 0555 03/19/12 0715  HGB 10.4* 10.9*  HCT 30.8* 32.1*    Assessment/Plan: Plan for discharge tomorrow   LOS: 1 day   Meelah Tallo 03/20/2012, 8:21 AM

## 2012-03-21 MED ORDER — IBUPROFEN 600 MG PO TABS: 600.0000 mg | ORAL_TABLET | Freq: Four times a day (QID) | ORAL | Status: DC

## 2012-03-21 MED ORDER — OXYCODONE-ACETAMINOPHEN 5-325 MG PO TABS: 1.0000 | ORAL_TABLET | ORAL | Status: DC | PRN

## 2012-03-21 NOTE — Discharge Summary (Signed)
Obstetric Discharge Summary Reason for Admission: induction of labor Prenatal Procedures: none Intrapartum Procedures: spontaneous vaginal delivery Postpartum Procedures: none Complications-Operative and Postpartum: none Hemoglobin  Date Value Range Status  03/20/2012 10.4* 12.0 - 15.0 g/dL Final     HCT  Date Value Range Status  03/20/2012 30.8* 36.0 - 46.0 % Final    Physical Exam:  General: alert, cooperative and appears stated age Lochia: appropriate Uterine Fundus: firm Incision: n/a DVT Evaluation: No evidence of DVT seen on physical exam. Negative Homan's sign. No cords or calf tenderness.  Discharge Diagnoses: Term Pregnancy-delivered  Discharge Information: Date: 03/21/2012 Activity: pelvic rest Diet: routine Medications: PNV, Ibuprofen and Percocet Condition: stable Instructions: refer to practice specific booklet Discharge to: home   Newborn Data: Live born female  Birth Weight: 8 lb 2.9 oz (3710 g) APGAR: 9, 9  Home with mother.  Madilyne Tadlock 03/21/2012, 8:03 AM

## 2012-03-21 NOTE — Progress Notes (Signed)
Post Partum Day 2 Subjective: no complaints, up ad lib, voiding and tolerating PO  Objective: Blood pressure 106/69, pulse 99, temperature 97.8 F (36.6 C), temperature source Oral, resp. rate 18, height 5\' 6"  (1.676 m), weight 240 lb (108.863 kg), last menstrual period 06/18/2011, SpO2 100.00%, unknown if currently breastfeeding.  Physical Exam:  General: alert, cooperative and appears stated age Lochia: appropriate Uterine Fundus: firm Incision: n/a DVT Evaluation: No evidence of DVT seen on physical exam. Negative Homan's sign. No cords or calf tenderness.   Basename 03/20/12 0555 03/19/12 0715  HGB 10.4* 10.9*  HCT 30.8* 32.1*    Assessment/Plan: Discharge home and Breastfeeding   LOS: 2 days   Ashley Lane 03/21/2012, 8:00 AM

## 2012-11-26 ENCOUNTER — Encounter: Payer: Self-pay | Admitting: Gynecology

## 2012-11-26 ENCOUNTER — Ambulatory Visit (INDEPENDENT_AMBULATORY_CARE_PROVIDER_SITE_OTHER): Payer: BC Managed Care – PPO | Admitting: Gynecology

## 2012-11-26 DIAGNOSIS — R102 Pelvic and perineal pain: Secondary | ICD-10-CM

## 2012-11-26 DIAGNOSIS — N920 Excessive and frequent menstruation with regular cycle: Secondary | ICD-10-CM

## 2012-11-26 DIAGNOSIS — N949 Unspecified condition associated with female genital organs and menstrual cycle: Secondary | ICD-10-CM

## 2012-11-26 LAB — CBC WITH DIFFERENTIAL/PLATELET
Basophils Absolute: 0.1 10*3/uL (ref 0.0–0.1)
Basophils Relative: 1 % (ref 0–1)
MCHC: 33.6 g/dL (ref 30.0–36.0)
Neutro Abs: 4.2 10*3/uL (ref 1.7–7.7)
Neutrophils Relative %: 58 % (ref 43–77)
RDW: 13.9 % (ref 11.5–15.5)

## 2012-11-26 NOTE — Patient Instructions (Addendum)
Follow up for sonohysterogram as scheduled. 

## 2012-11-26 NOTE — Progress Notes (Signed)
Patient presents complaining of pelvic pain and heavier menses relatively quickly in January and has been having monthly regular menses although they are heavy. No intermenstrual bleeding. Notes over the last month or so pelvic pain primarily right lower quadrant that comes and goes not associated necessarily with her menses and also now occurring with intercourse. No urinary symptoms, nausea vomiting diarrhea or constipation. Not using anything for birth control at this time and they are deciding what they want to do from a contraceptive standpoint.  Exam with Selena Batten Assistant Abdomen soft nontender without masses guarding rebound or organomegaly. Pelvic external BUS vagina normal. Cervix normal. Uterus normal sized mobile nontender. Adnexa without masses or tenderness. Rectovaginal exam is normal.  Assessment and plan: Heavier menses with intermittent pelvic pain. Discussed differential to include endometriosis, adhesive disease, nonpalpable abnormality such as ovarian cysts, adenomyosis. We'll start with baseline CBC and TSH rule out postpartum thyroid dysfunction. Sonohysterogram to rule out pelvic pathology. Possible followup treatment to include menstrual suppression with oral contraceptives or Mirena IUD or possible laparoscopy. Patient will followup for testing and we'll go from there.

## 2012-11-27 LAB — URINALYSIS W MICROSCOPIC + REFLEX CULTURE
Casts: NONE SEEN
Hgb urine dipstick: NEGATIVE
Leukocytes, UA: NEGATIVE
Nitrite: NEGATIVE
Protein, ur: NEGATIVE mg/dL
pH: 5.5 (ref 5.0–8.0)

## 2012-11-27 LAB — TSH: TSH: 0.841 u[IU]/mL (ref 0.350–4.500)

## 2012-12-09 ENCOUNTER — Other Ambulatory Visit: Payer: Self-pay | Admitting: Gynecology

## 2012-12-09 DIAGNOSIS — R102 Pelvic and perineal pain: Secondary | ICD-10-CM

## 2012-12-09 DIAGNOSIS — N920 Excessive and frequent menstruation with regular cycle: Secondary | ICD-10-CM

## 2012-12-13 ENCOUNTER — Telehealth: Payer: Self-pay | Admitting: *Deleted

## 2012-12-13 NOTE — Telephone Encounter (Signed)
Although pain is gone it does not address her heavier periods. If she continues to have heavy periods then I would suggest a sonohysterogram to rule out intracavitary abnormalities such as polyps or fibroids.

## 2012-12-13 NOTE — Telephone Encounter (Signed)
Pt informed with the below note. 

## 2012-12-13 NOTE — Telephone Encounter (Signed)
Pt was schedule for [facility] on 12/17/12 due to Heavier menses with intermittent pelvic pain. Pt has canceled this appointment because the pain has stopped. Please advise

## 2012-12-16 ENCOUNTER — Ambulatory Visit (INDEPENDENT_AMBULATORY_CARE_PROVIDER_SITE_OTHER): Payer: BC Managed Care – PPO | Admitting: Gynecology

## 2012-12-16 ENCOUNTER — Ambulatory Visit (INDEPENDENT_AMBULATORY_CARE_PROVIDER_SITE_OTHER): Payer: BC Managed Care – PPO

## 2012-12-16 ENCOUNTER — Encounter: Payer: Self-pay | Admitting: Gynecology

## 2012-12-16 DIAGNOSIS — N8300 Follicular cyst of ovary, unspecified side: Secondary | ICD-10-CM

## 2012-12-16 DIAGNOSIS — N949 Unspecified condition associated with female genital organs and menstrual cycle: Secondary | ICD-10-CM

## 2012-12-16 DIAGNOSIS — R102 Pelvic and perineal pain: Secondary | ICD-10-CM

## 2012-12-16 DIAGNOSIS — N920 Excessive and frequent menstruation with regular cycle: Secondary | ICD-10-CM | POA: Insufficient documentation

## 2012-12-16 NOTE — Patient Instructions (Signed)
Office will call you with biopsy results. Call us if you want to proceed with the Mirena IUD.

## 2012-12-16 NOTE — Progress Notes (Signed)
Patient presents for sonohysterogram due to intermittent pelvic pain and menorrhagia. Her hemoglobin returned 11.9 and she had a normal TSH. She notes that her pelvic pain has resolved but continues to have heavier menses.  Ultrasound shows uterus normal size and echotexture. Endometrial echo 10.5 mm. Right and left ovaries normal. Cul-de-sac negative.  Sonohysterogram performed, sterile technique, easy catheter introduction, good distention with no abnormalities. Endometrial sample taken.  Assessment and plan: Menorrhagia with negative sonohysterogram. Patient will follow up her biopsy results. Options for management reviewed. She still needs contraception. Strongly recommended she consider Mirena IUD which would address 2 issues. Alternatives include oral contraceptives, endometrial ablation, vasectomy and tubal sterilization all reviewed. Patient to followup with her decision.

## 2012-12-17 ENCOUNTER — Ambulatory Visit: Payer: BC Managed Care – PPO | Admitting: Gynecology

## 2012-12-17 ENCOUNTER — Other Ambulatory Visit: Payer: BC Managed Care – PPO

## 2013-03-04 ENCOUNTER — Ambulatory Visit (INDEPENDENT_AMBULATORY_CARE_PROVIDER_SITE_OTHER): Payer: BC Managed Care – PPO | Admitting: Physician Assistant

## 2013-03-04 VITALS — BP 110/66 | HR 73 | Temp 98.4°F | Resp 18 | Ht 67.0 in | Wt 238.0 lb

## 2013-03-04 DIAGNOSIS — J029 Acute pharyngitis, unspecified: Secondary | ICD-10-CM

## 2013-03-04 LAB — POCT RAPID STREP A (OFFICE): Rapid Strep A Screen: NEGATIVE

## 2013-03-04 MED ORDER — AZITHROMYCIN 250 MG PO TABS: ORAL_TABLET | ORAL | Status: DC

## 2013-03-04 NOTE — Progress Notes (Signed)
Patient ID: Ashley Lane MRN: [id number], DOB: 05-08-1975, 37 y.o. Date of Encounter: 03/04/2013, 3:59 PM  Primary Physician: No primary provider on file.  Chief Complaint:  Chief Complaint  Patient presents with  . Sore Throat    daughter has strep    HPI: 37 y.o. female presents with a 7 day history of sore throat, fever, chills, and now otalgia. Subjective fever and chills. No cough, congestion, rhinorrhea, or sinus pressure. Normal hearing. No GI complaints. Able to swallow saliva, but hurts to do so. Decreased appetite secondary to sore throat. Her daughter was diagnosed with RST positive strep throat today by her pediatrician. Her son his playing in one of the high school football state championship games on 03/05/13 at [facility]   Past Medical History  Diagnosis Date  . H/O varicella   . AMA (advanced maternal age) multigravida 35+   . Hx of pyelonephritis   . Anemia     hx     Home Meds: Prior to Admission medications   Medication Sig Start Date End Date Taking? Authorizing Provider  Multiple Vitamin (MULTIVITAMIN) tablet Take 1 tablet by mouth daily.   Yes Historical Provider, MD    Allergies:  Allergies  Allergen Reactions  . Latex Itching    History   Social History  . Marital Status: Married    Spouse Name: N/A    Number of Children: N/A  . Years of Education: N/A   Occupational History  . Not on file.   Social History Main Topics  . Smoking status: Never Smoker   . Smokeless tobacco: Never Used  . Alcohol Use: No  . Drug Use: No  . Sexual Activity: Yes    Birth Control/ Protection: None   Other Topics Concern  . Not on file   Social History Narrative  . No narrative on file     Review of Systems: Constitutional: positive for chills and fever HEENT: see above Cardiovascular: negative for chest pain or palpitations Respiratory: negative for wheezing, or shortness of breath Abdominal: negative for abdominal pain, nausea,  vomiting or diarrhea Dermatological: negative for rash Neurologic: positive for headache   Physical Exam: Blood pressure 110/66, pulse 73, temperature 98.4 F (36.9 C), temperature source Oral, resp. rate 18, height 5\' 7"  (1.702 m), weight 238 lb (107.956 kg), last menstrual period 03/01/2013, SpO2 99.00%, not currently breastfeeding., Body mass index is 37.27 kg/(m^2). General: Well developed, well nourished, in no acute distress. Head: Normocephalic, atraumatic, eyes without discharge, sclera non-icteric, nares are congested. Bilateral auditory canals clear, TM's are without perforation, pearly grey with reflective cone of light bilaterally. No sinus TTP. Oral cavity moist, dentition normal. Posterior pharynx erythematous with 2+ tonsils bilaterally. No post nasal drip, peritonsillar abscess, or tonsillar exudate. Uvula midline.  Neck: Supple. No thyromegaly. Full ROM. Lymph nodes: less than 2 cm AC bilaterally. Lungs: Clear bilaterally to auscultation without wheezes, rales, or rhonchi. Breathing is unlabored. Heart: RRR with S1 S2. No murmurs, rubs, or gallops appreciated. Msk:  Strength and tone normal for age. Extremities: No clubbing or cyanosis. No edema. Neuro: Alert and oriented X 3. Moves all extremities spontaneously. CNII-XII grossly in tact. Psych:  Responds to questions appropriately with a normal affect.   Labs: Results for orders placed in visit on 03/04/13  POCT RAPID STREP A (OFFICE)      Result Value Range   Rapid Strep A Screen Negative  Negative   Throat culture pending  ASSESSMENT AND PLAN:  37  y.o. female with pharyngitis -Azithromycin 250 MG #6 2 po first day then 1 po next 4 days no RF, to fill if throat culture is positive or if worsens over night secondary to the upcoming state championship football game -New tooth brush -Await throat culture results -Tylenol/Motrin prn -Rest/fluids -RTC precautions -RTC 3-5 days if no improvement  Signed, Eula Listen,  PA-C [facility] [place], [place] [address] [phone] 03/04/2013 3:59 PM

## 2013-03-06 LAB — CULTURE, GROUP A STREP: Organism ID, Bacteria: NORMAL

## 2013-06-27 ENCOUNTER — Other Ambulatory Visit: Payer: Self-pay | Admitting: Nurse Practitioner

## 2013-06-27 DIAGNOSIS — N644 Mastodynia: Secondary | ICD-10-CM

## 2013-08-31 ENCOUNTER — Ambulatory Visit
Admission: RE | Admit: 2013-08-31 | Discharge: 2013-08-31 | Disposition: A | Discharge status: Home / Self Care | Payer: BC Managed Care – PPO | Source: Ambulatory Visit | Attending: Nurse Practitioner | Admitting: Nurse Practitioner

## 2013-08-31 DIAGNOSIS — N644 Mastodynia: Secondary | ICD-10-CM

## 2013-09-23 ENCOUNTER — Ambulatory Visit (INDEPENDENT_AMBULATORY_CARE_PROVIDER_SITE_OTHER): Payer: BC Managed Care – PPO | Admitting: Family Medicine

## 2013-09-23 ENCOUNTER — Ambulatory Visit: Payer: BC Managed Care – PPO

## 2013-09-23 VITALS — BP 133/77 | HR 87 | Temp 98.6°F | Resp 18 | Ht 66.0 in | Wt 214.6 lb

## 2013-09-23 DIAGNOSIS — W57XXXA Bitten or stung by nonvenomous insect and other nonvenomous arthropods, initial encounter: Secondary | ICD-10-CM

## 2013-09-23 DIAGNOSIS — M533 Sacrococcygeal disorders, not elsewhere classified: Secondary | ICD-10-CM

## 2013-09-23 DIAGNOSIS — M2550 Pain in unspecified joint: Secondary | ICD-10-CM

## 2013-09-23 DIAGNOSIS — T148 Other injury of unspecified body region: Secondary | ICD-10-CM

## 2013-09-23 LAB — COMPREHENSIVE METABOLIC PANEL
ALBUMIN: 4.2 g/dL (ref 3.5–5.2)
ALT: 24 U/L (ref 0–35)
AST: 20 U/L (ref 0–37)
Alkaline Phosphatase: 59 U/L (ref 39–117)
BUN: 12 mg/dL (ref 6–23)
CHLORIDE: 101 meq/L (ref 96–112)
CO2: 25 mEq/L (ref 19–32)
Calcium: 9.7 mg/dL (ref 8.4–10.5)
Creat: 0.62 mg/dL (ref 0.50–1.10)
Glucose, Bld: 91 mg/dL (ref 70–99)
POTASSIUM: 4.3 meq/L (ref 3.5–5.3)
Sodium: 136 mEq/L (ref 135–145)
Total Bilirubin: 0.3 mg/dL (ref 0.2–1.2)
Total Protein: 7.2 g/dL (ref 6.0–8.3)

## 2013-09-23 LAB — CBC WITH DIFFERENTIAL/PLATELET
Basophils Absolute: 0.1 10*3/uL (ref 0.0–0.1)
Basophils Relative: 1 % (ref 0–1)
EOS PCT: 1 % (ref 0–5)
Eosinophils Absolute: 0.1 10*3/uL (ref 0.0–0.7)
HEMATOCRIT: 35.7 % — AB (ref 36.0–46.0)
HEMOGLOBIN: 12.2 g/dL (ref 12.0–15.0)
LYMPHS ABS: 2.4 10*3/uL (ref 0.7–4.0)
LYMPHS PCT: 31 % (ref 12–46)
MCH: 27.7 pg (ref 26.0–34.0)
MCHC: 34.2 g/dL (ref 30.0–36.0)
MCV: 81 fL (ref 78.0–100.0)
Monocytes Absolute: 0.5 10*3/uL (ref 0.1–1.0)
Monocytes Relative: 7 % (ref 3–12)
Neutro Abs: 4.7 10*3/uL (ref 1.7–7.7)
Neutrophils Relative %: 60 % (ref 43–77)
Platelets: 393 10*3/uL (ref 150–400)
RBC: 4.41 MIL/uL (ref 3.87–5.11)
RDW: 13.2 % (ref 11.5–15.5)
WBC: 7.8 10*3/uL (ref 4.0–10.5)

## 2013-09-23 LAB — RHEUMATOID FACTOR: Rhuematoid fact SerPl-aCnc: <10 IU/mL (ref <=14)

## 2013-09-23 LAB — POCT URINE PREGNANCY: Preg Test, Ur: NEGATIVE

## 2013-09-23 NOTE — Progress Notes (Addendum)
[facility] [address] [place] [address] [phone]  Date:  09/23/2013   Name:  Ashley Lane   DOB:  03-Mar-1976   MRN:  [id number]  PCP:  No PCP Per Patient    Chief Complaint: Tick Removal and Tailbone Pain   History of Present Illness:  Ashley Lane is a 38 y.o. very pleasant female patient who presents with the following:  She had a tick bite 3 or 4 weeks ago.  She pulled it off the back of her leftt leg. It was not engorged, seemed to be a dog tick.  Then developed a rash over this araea an also on her right shin.  She saw a dermatologist 10 days ago who treated her with cortisone and doxycycline.  Her derm did not think that she has lyme or RMSF, but did think she might have some sort of "tick fever."  She notes that "my tailbone is hurting really bad" for about one month.  Last week she had "2 spells where I thought I was going to pass out."  She felt nauseated, this occurred once while in the car and once while working in the yard.  She notes that her hips and shoulders also hurt over the last couple of weeks.  She feels just tired and achy, no fever.    She has 3 children ranging from teens to an 69 month old.   She had vaginal births but does not recall any trauma to her tailbone during delivery   Her LMP was 6/23  Patient Active Problem List   Diagnosis Date Noted  . Menorrhagia 12/16/2012    Past Medical History  Diagnosis Date  . H/O varicella   . AMA (advanced maternal age) multigravida 8+   . Hx of pyelonephritis   . Anemia     hx    Past Surgical History  Procedure Laterality Date  . No past surgeries      History  Substance Use Topics  . Smoking status: Never Smoker   . Smokeless tobacco: Never Used  . Alcohol Use: No    Family History  Problem Relation Age of Onset  . Hypertension Mother   . Thyroid disease Mother   . Cancer Sister     cervical  . Hypertension Maternal Grandmother   . Thyroid disease Maternal  Grandmother   . Hypertension Maternal Grandfather     Allergies  Allergen Reactions  . Latex Itching    Medication list has been reviewed and updated.  Current Outpatient Prescriptions on File Prior to Visit  Medication Sig Dispense Refill  . Multiple Vitamin (MULTIVITAMIN) tablet Take 1 tablet by mouth daily.       No current facility-administered medications on file prior to visit.    Review of Systems:  As per HPI- otherwise negative.   Physical Examination: Filed Vitals:   09/23/13 1159  BP: 133/77  Pulse: 87  Temp: 98.6 F (37 C)  Resp: 18   Filed Vitals:   09/23/13 1159  Height: 5' 6"  (1.676 m)  Weight: 214 lb 9.6 oz (97.342 kg)   Body mass index is 34.65 kg/(m^2). Ideal Body Weight: Weight in (lb) to have BMI = 25: 154.6  GEN: WDWN, NAD, Non-toxic, A & O x 3, obese, looks well HEENT: Atraumatic, Normocephalic. Neck supple. No masses, No LAD.  Bilateral TM wnl, oropharynx normal.  PEERL,EOMI.   Ears and Nose: No external deformity. CV: RRR, No M/G/R. No JVD. No thrill.  No extra heart sounds. PULM: CTA B, no wheezes, crackles, rhonchi. No retractions. No resp. distress. No accessory muscle use. ABD: S, NT, ND EXTR: No c/c/e.  No discernable decrease in her joint ROM, joints seem to move freely without pain.   NEURO Normal gait.  PSYCH: Normally interactive. Conversant. Not depressed or anxious appearing.  Calm demeanor.  She is tender over the coccyx- no sign of abscess or other infection, skin is normal, no heat or redness or swelling She has what appears to be a patch of resolving eczema on her right shin.  No other rash [facility] reading (PRIMARY) by  Dr. Lorelei Pont. Coccyx:  Negative  SACRUM AND COCCYX - 2+ VIEW  COMPARISON: None.  FINDINGS: There is no evidence of fracture or other focal bone lesions.  IMPRESSION: Negative.  Results for orders placed in visit on 09/23/13  POCT URINE PREGNANCY      Result Value Ref Range   Preg Test, Ur Negative       Assessment and Plan: Joint pain - Plan: CBC with Differential, POCT urine pregnancy, Comprehensive metabolic panel, Sedimentation Rate, Cyclic Citrul Peptide Antibody, IGG, Rheumatoid factor, ANA, CANCELED: POCT SEDIMENTATION RATE  Tick bite - Plan: B. Burgdorfi Antibodies  Coccydynia - Plan: DG Sacrum/Coccyx  Chihiro is here today with various body aches and pains.  She was concerned that she could have Lyme disease.  However she also recalls being told that one of her labs might indicate an autoimmune issue many years ago- she did not have any further evaluation in this regard.  Reassured that Lyme is very unlikely but will check a titer for her.  Also look at rheum and other basic labs as above.   She may use OTC medications for aches, and will be in touch with her when I have more lab information- soon    Signed Lamar Blinks, MD  7/7: received labs  Results for orders placed in visit on 09/23/13  CBC WITH DIFFERENTIAL      Result Value Ref Range   WBC 7.8  4.0 - 10.5 K/uL   RBC 4.41  3.87 - 5.11 MIL/uL   Hemoglobin 12.2  12.0 - 15.0 g/dL   HCT 35.7 (*) 36.0 - 46.0 %   MCV 81.0  78.0 - 100.0 fL   MCH 27.7  26.0 - 34.0 pg   MCHC 34.2  30.0 - 36.0 g/dL   RDW 13.2  11.5 - 15.5 %   Platelets 393  150 - 400 K/uL   Neutrophils Relative % 60  43 - 77 %   Neutro Abs 4.7  1.7 - 7.7 K/uL   Lymphocytes Relative 31  12 - 46 %   Lymphs Abs 2.4  0.7 - 4.0 K/uL   Monocytes Relative 7  3 - 12 %   Monocytes Absolute 0.5  0.1 - 1.0 K/uL   Eosinophils Relative 1  0 - 5 %   Eosinophils Absolute 0.1  0.0 - 0.7 K/uL   Basophils Relative 1  0 - 1 %   Basophils Absolute 0.1  0.0 - 0.1 K/uL   Smear Review Criteria for review not met    COMPREHENSIVE METABOLIC PANEL      Result Value Ref Range   Sodium 136  135 - 145 mEq/L   Potassium 4.3  3.5 - 5.3 mEq/L   Chloride 101  96 - 112 mEq/L   CO2 25  19 - 32 mEq/L   Glucose, Bld 91  70 - 99 mg/dL  BUN 12  6 - 23 mg/dL   Creat 0.62  0.50  - 1.10 mg/dL   Total Bilirubin 0.3  0.2 - 1.2 mg/dL   Alkaline Phosphatase 59  39 - 117 U/L   AST 20  0 - 37 U/L   ALT 24  0 - 35 U/L   Total Protein 7.2  6.0 - 8.3 g/dL   Albumin 4.2  3.5 - 5.2 g/dL   Calcium 9.7  8.4 - 10.5 mg/dL  SEDIMENTATION RATE      Result Value Ref Range   Sed Rate 17  0 - 22 mm/hr  CYCLIC CITRUL PEPTIDE ANTIBODY, IGG      Result Value Ref Range   Cyclic Citrullin Peptide Ab <2.0  0.0 - 5.0 U/mL  RHEUMATOID FACTOR      Result Value Ref Range   Rheumatoid Factor <10  <=14 IU/mL  ANA      Result Value Ref Range   ANA NEG  NEGATIVE  B. BURGDORFI ANTIBODIES      Result Value Ref Range   B burgdorferi Ab IgG+IgM 0.51    POCT URINE PREGNANCY      Result Value Ref Range   Preg Test, Ur Negative     Called her and went over her labs.  All ok, no evidence of autoimmune disorder.   Overall her aches and pains are better although her tailbone still hurts.  She will continue conservative therapy, and if not better in a week or 2 she will call or come back in.

## 2013-09-23 NOTE — Patient Instructions (Signed)
I will be in touch with the rest of your labs asap.   Try taking some ibuprofen and/ or tylenol as needed.  Keep an eye on your temperature and let me know if you have any fever or other flu- like symptoms.

## 2013-09-24 LAB — SEDIMENTATION RATE: Sed Rate: 17 mm/hr (ref 0–22)

## 2013-09-26 LAB — B. BURGDORFI ANTIBODIES: B burgdorferi Ab IgG+IgM: 0.51 {ISR}

## 2013-09-26 LAB — CYCLIC CITRUL PEPTIDE ANTIBODY, IGG: Cyclic Citrullin Peptide Ab: <2 U/mL (ref 0.0–5.0)

## 2013-09-26 LAB — ANA: Anti Nuclear Antibody(ANA): NEGATIVE

## 2013-09-27 ENCOUNTER — Encounter: Payer: Self-pay | Admitting: Family Medicine

## 2013-10-01 IMAGING — US US OB COMP +14 WK
1 series · 12 of 28 positions shown · non-contrast
Comparison: none

[Series 1: us ob comp +14 wk · 65 acquisitions, 12 frames shown]
[im 3/65]
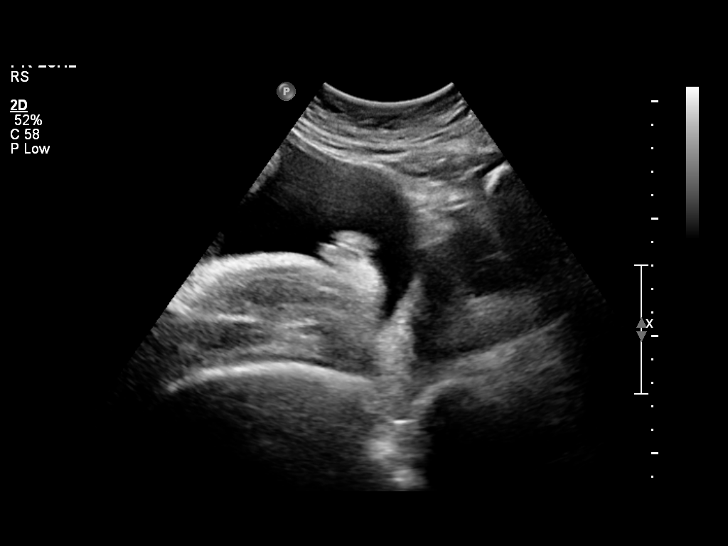
[im 8/65]
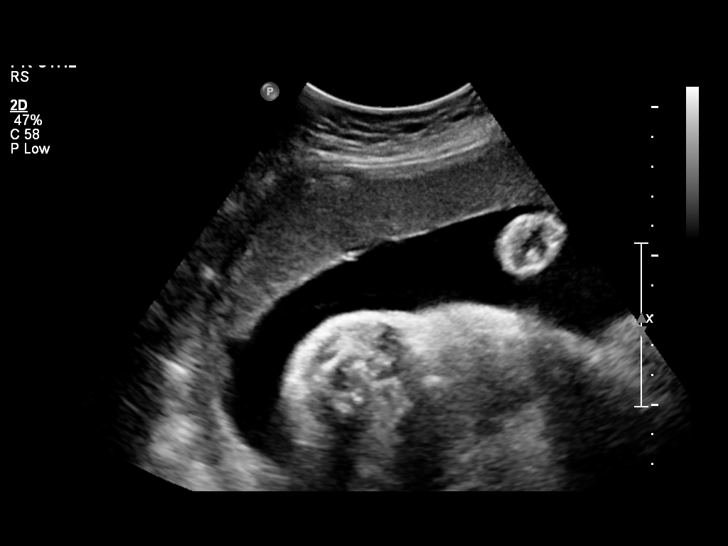
[im 12/65]
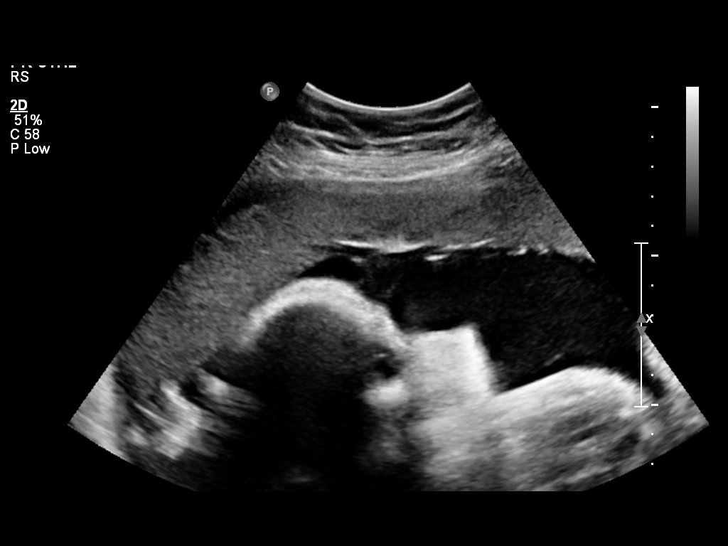
[im 19/65]
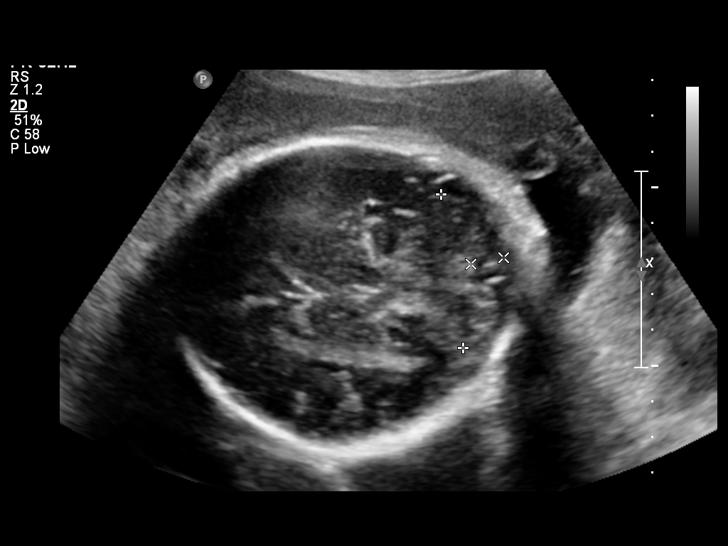
[im 24/65]
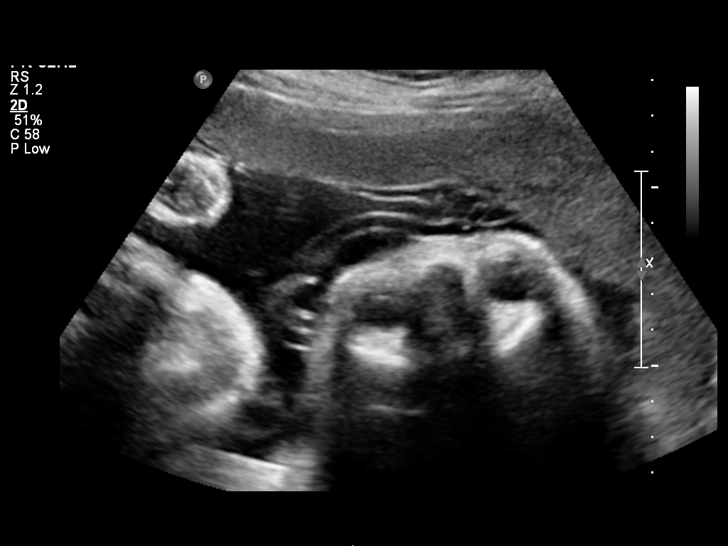
[im 29/65]
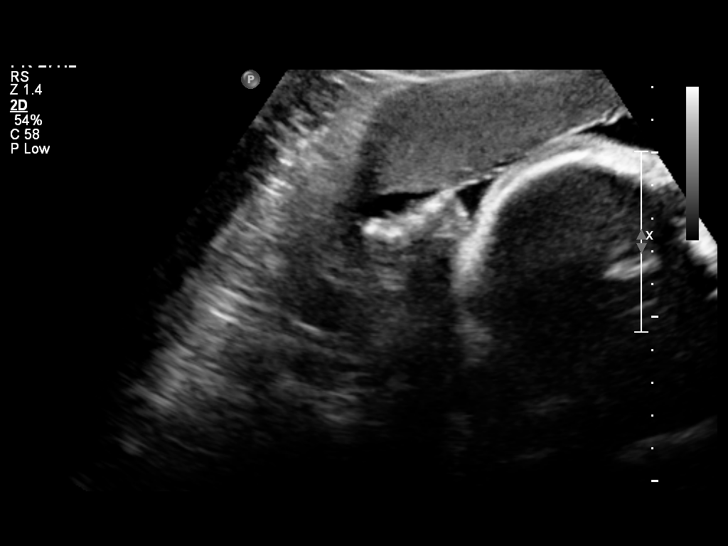
[im 36/65]
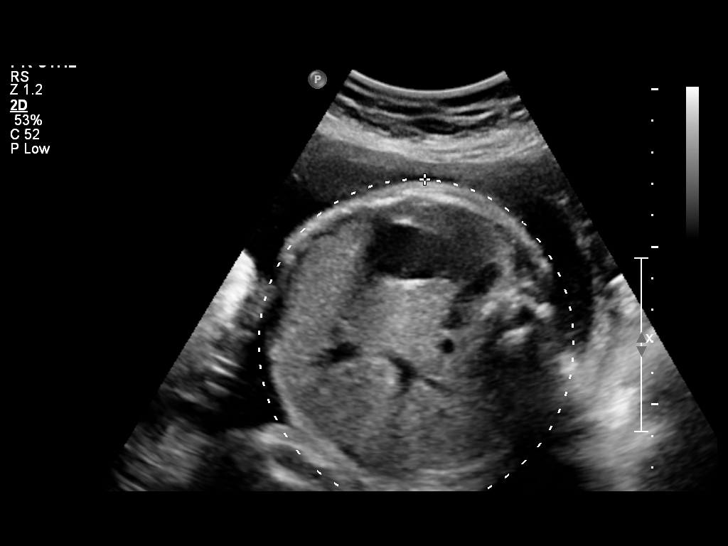
[im 41/65]
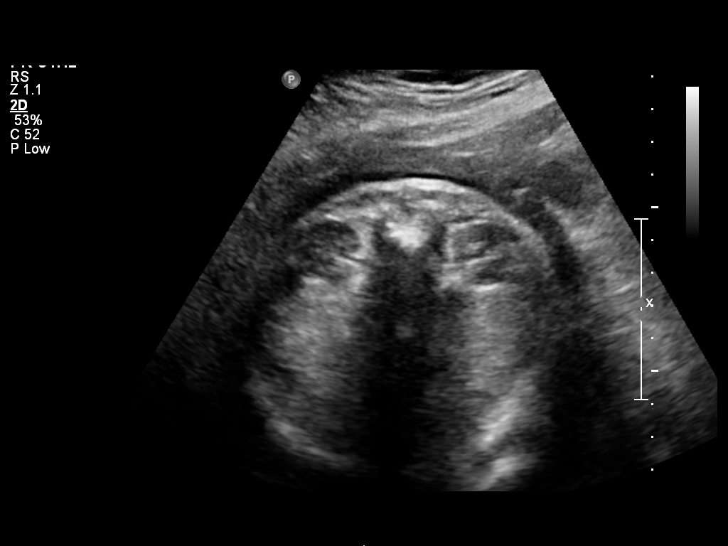
[im 46/65]
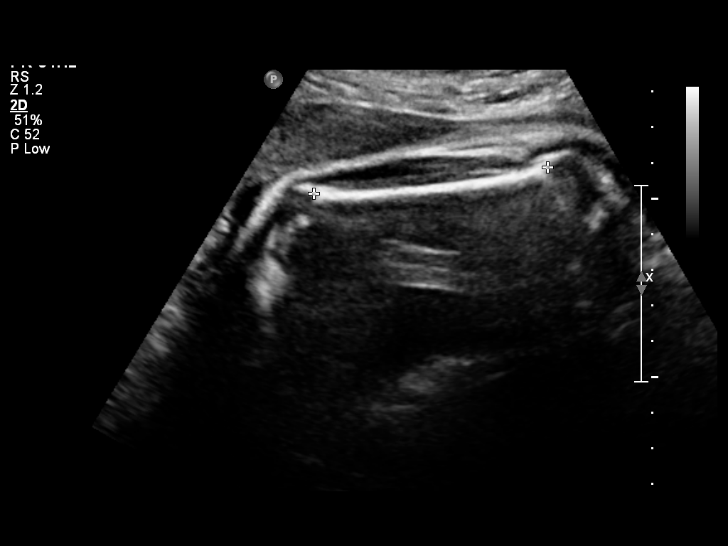
[im 53/65]
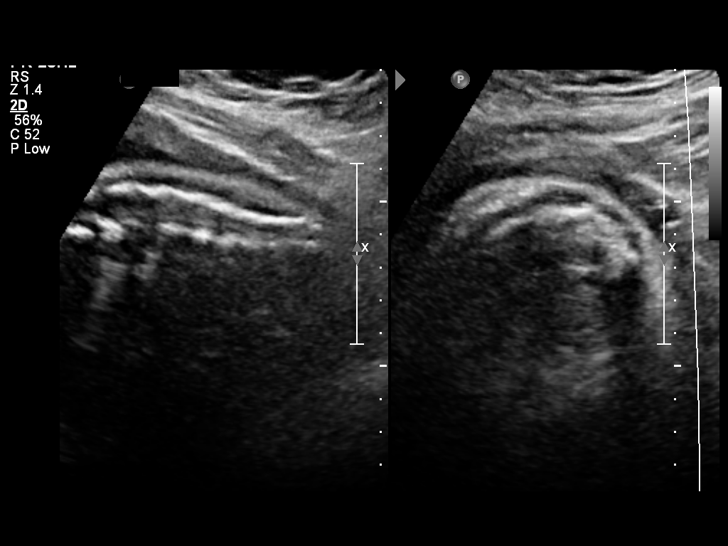
[im 57/65]
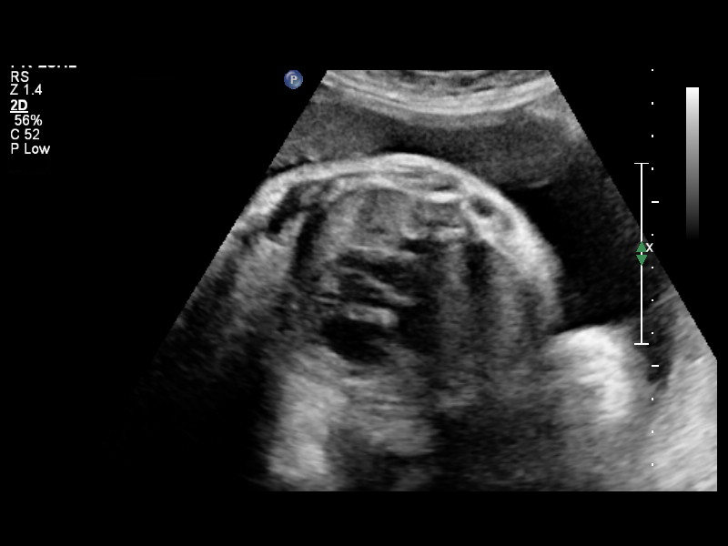
[im 62/65]
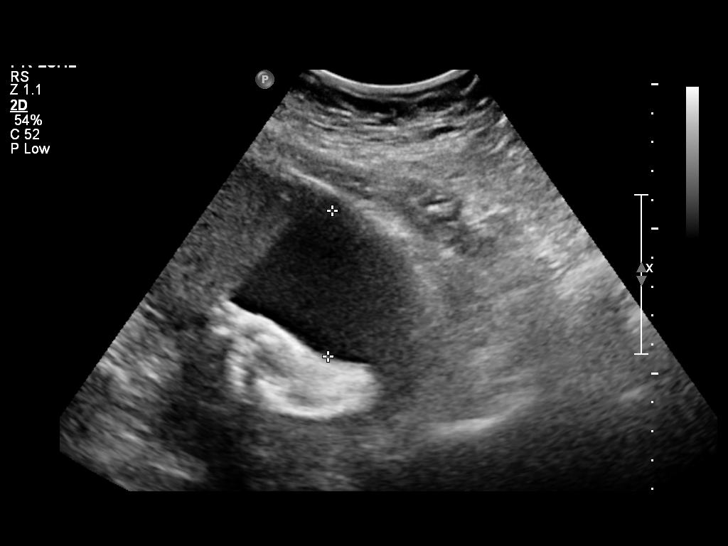

[12 of 28 positions shown; findings below may reference images not displayed]

OBSTETRICS REPORT
                      (Signed Final 02/03/2012 [DATE])

Service(s) Provided

 US OB COMP + 14 WK                                    76805.1
Indications

 Vaginal bleeding, unknown etiology
 Diabetes - Gestational, A1 (diet controlled)
Fetal Evaluation

 Num Of Fetuses:    1
 Fetal Heart Rate:  140                         bpm
 Cardiac Activity:  Observed
 Presentation:      Frank breech
 Placenta:          Anterior, above cervical os
 P. Cord            Not well visualized
 Insertion:

 Comment:    No definite evidence of acute placental abruption.

 Amniotic Fluid
 AFI FV:      Subjectively within normal limits
 AFI Sum:     21.94   cm      83   %Tile     Larg Pckt:   9.16   cm
 RUQ:   3.86   cm    RLQ:    9.16   cm    LUQ:   3.89    cm   LLQ:    5.03   cm
Biometry

 BPD:     84.9  mm    G. Age:   34w 1d                CI:        73.19   70 - 86
                                                      FL/HC:      21.6   19.9 -

 HC:     315.4  mm    G. Age:   35w 3d       65  %    HC/AC:      1.01   0.96 -

 AC:     313.7  mm    G. Age:   35w 2d       93  %    FL/BPD:     80.2   71 - 87
 FL:      68.1  mm    G. Age:   35w 0d       79  %    FL/AC:      21.7   20 - 24
 Est. FW:    1110  gm    5 lb 12 oz      83  %
Gestational Age

 Clinical EDD:  33w 3d                                        EDD:   03/20/12
 U/S Today:     35w 0d                                        EDD:   03/09/12
 Best:          33w 3d    Det. By:   Clinical EDD             EDD:   03/20/12
Anatomy
 Cranium:          Appears normal         Aortic Arch:      Not well visualized
 Fetal Cavum:      Appears normal         Ductal Arch:      Not well visualized
 Ventricles:       Appears normal         Diaphragm:        Basic anatomy
                                                            exam per order
 Choroid Plexus:   Not well visualized    Stomach:          Appears normal, left
                                                            sided
 Cerebellum:       Appears normal         Abdomen:          Appears normal
 Posterior Fossa:  Appears normal         Abdominal Wall:   Not well visualized
 Nuchal Fold:      Not applicable (>20    Cord Vessels:     Appears normal (3
                   wks GA)                                  vessel cord)
 Face:             Orbits appear          Kidneys:          Appear normal
                   normal
 Lips:             Appears normal         Bladder:          Appears normal
 Heart:            Not well visualized    Spine:            Appears normal
 RVOT:             Not well visualized    Lower             Not well visualized
                                          Extremities:
 LVOT:             Appears normal         Upper             Not well visualized
                                          Extremities:

 Other:  Technically difficult due to advanced GA and fetal position. Four
         extremities seen but suboptimally evaluated.  Parents do not wish to
         know sex of fetus.
Cervix Uterus Adnexa

 Cervical Length:   3.3       cm

 Cervix:       Normal appearance by transabdominal scan.
 Uterus:       No abnormality visualized.
 Cul De Sac:   No free fluid seen.
 Left Ovary:   Not visualized.
 Right Ovary:  Not visualized.

 Adnexa:     No abnormality visualized.
Impression

 Assigned GA is currently 33w 3d by established clinical EDD.
 EFW currently at 83 %ile for this ROJER.
 Suboptimal visualization of anatomy due to advanced GA,
 however no fetal anomalies seen involving visualized
 anatomy.
 Amniotic fluid within normal limits, with AFI of 21.94 cm.
 Normal cervical length.
 No placental abruption or previa identifed.

## 2014-06-01 ENCOUNTER — Encounter (HOSPITAL_BASED_OUTPATIENT_CLINIC_OR_DEPARTMENT_OTHER): Payer: Self-pay | Admitting: Emergency Medicine

## 2014-06-01 ENCOUNTER — Emergency Department (HOSPITAL_BASED_OUTPATIENT_CLINIC_OR_DEPARTMENT_OTHER): Payer: BLUE CROSS/BLUE SHIELD

## 2014-06-01 DIAGNOSIS — R5383 Other fatigue: Secondary | ICD-10-CM | POA: Diagnosis not present

## 2014-06-01 DIAGNOSIS — R0789 Other chest pain: Secondary | ICD-10-CM | POA: Diagnosis not present

## 2014-06-01 DIAGNOSIS — R197 Diarrhea, unspecified: Secondary | ICD-10-CM | POA: Insufficient documentation

## 2014-06-01 DIAGNOSIS — Z91040 Latex allergy status: Secondary | ICD-10-CM | POA: Diagnosis not present

## 2014-06-01 DIAGNOSIS — Z8742 Personal history of other diseases of the female genital tract: Secondary | ICD-10-CM | POA: Insufficient documentation

## 2014-06-01 DIAGNOSIS — Z79899 Other long term (current) drug therapy: Secondary | ICD-10-CM | POA: Insufficient documentation

## 2014-06-01 DIAGNOSIS — Z8619 Personal history of other infectious and parasitic diseases: Secondary | ICD-10-CM | POA: Diagnosis not present

## 2014-06-01 DIAGNOSIS — D649 Anemia, unspecified: Secondary | ICD-10-CM | POA: Insufficient documentation

## 2014-06-01 DIAGNOSIS — R079 Chest pain, unspecified: Secondary | ICD-10-CM | POA: Diagnosis present

## 2014-06-01 LAB — CBC
HEMATOCRIT: 38.7 % (ref 36.0–46.0)
HEMOGLOBIN: 13 g/dL (ref 12.0–15.0)
MCH: 28.2 pg (ref 26.0–34.0)
MCHC: 33.6 g/dL (ref 30.0–36.0)
MCV: 83.9 fL (ref 78.0–100.0)
Platelets: 404 10*3/uL — ABNORMAL HIGH (ref 150–400)
RBC: 4.61 MIL/uL (ref 3.87–5.11)
RDW: 12.8 % (ref 11.5–15.5)
WBC: 10 10*3/uL (ref 4.0–10.5)

## 2014-06-01 LAB — BASIC METABOLIC PANEL
Anion gap: 9 (ref 5–15)
BUN: 14 mg/dL (ref 6–23)
CO2: 25 mmol/L (ref 19–32)
Calcium: 9.4 mg/dL (ref 8.4–10.5)
Chloride: 104 mmol/L (ref 96–112)
Creatinine, Ser: 0.81 mg/dL (ref 0.50–1.10)
GFR calc non Af Amer: >90 mL/min (ref >90)
Glucose, Bld: 151 mg/dL — ABNORMAL HIGH (ref 70–99)
POTASSIUM: 3.5 mmol/L (ref 3.5–5.1)
Sodium: 138 mmol/L (ref 135–145)

## 2014-06-01 LAB — TROPONIN I: Troponin I: <0.03 ng/mL (ref <0.031)

## 2014-06-01 NOTE — ED Notes (Signed)
Patient reports chest pain which began this evening.  Report this began while washing dishes and then began feeling "sick".  Reports she had diarrhea at this point and then felt like she was going to pass out and "could not see". Reports left arm went numb and throat was feeling weird.

## 2014-06-02 ENCOUNTER — Emergency Department (HOSPITAL_BASED_OUTPATIENT_CLINIC_OR_DEPARTMENT_OTHER)
Admission: EM | Admit: 2014-06-02 | Discharge: 2014-06-02 | Disposition: A | Discharge status: Home / Self Care | Payer: BLUE CROSS/BLUE SHIELD | Attending: Emergency Medicine | Admitting: Emergency Medicine

## 2014-06-02 ENCOUNTER — Encounter (HOSPITAL_BASED_OUTPATIENT_CLINIC_OR_DEPARTMENT_OTHER): Payer: Self-pay | Admitting: Emergency Medicine

## 2014-06-02 DIAGNOSIS — R0789 Other chest pain: Secondary | ICD-10-CM

## 2014-06-02 MED ORDER — METHOCARBAMOL 500 MG PO TABS
1000.0000 mg | ORAL_TABLET | Freq: Once | ORAL | Status: AC
  Administered 2014-06-02: 1000 mg via ORAL
  Filled 2014-06-02: qty 2

## 2014-06-02 MED ORDER — NAPROXEN 500 MG PO TABS: 500.0000 mg | ORAL_TABLET | Freq: Two times a day (BID) | ORAL | Status: DC

## 2014-06-02 MED ORDER — NAPROXEN 250 MG PO TABS
500.0000 mg | ORAL_TABLET | Freq: Once | ORAL | Status: AC
  Administered 2014-06-02: 500 mg via ORAL
  Filled 2014-06-02: qty 2

## 2014-06-02 MED ORDER — METHOCARBAMOL 500 MG PO TABS: 500.0000 mg | ORAL_TABLET | Freq: Two times a day (BID) | ORAL | Status: DC

## 2014-06-02 NOTE — ED Provider Notes (Signed)
CSN: [id number]     Arrival date & time 06/01/14  2107 History  This chart was scribed for Ashley Tramel, MD by Annye Asa, ED Scribe. This patient was seen in room MH04/MH04 and the patient's care was started at 12:52 AM.    Chief Complaint  Patient presents with  . Chest Pain   Patient is a 39 y.o. female presenting with chest pain. The history is provided by the patient. No language interpreter was used.  Chest Pain Pain location:  L chest Pain quality comment:  Squeezing Pain radiates to:  Does not radiate Pain radiates to the back: no   Pain severity:  Mild Onset quality:  Sudden Duration:  3 days Timing:  Constant Progression:  Partially resolved Chronicity:  New Context: not breathing and no stress   Relieved by:  Nothing Exacerbated by: applied pressure. Ineffective treatments:  None tried Associated symptoms: fatigue   Associated symptoms: no cough, no palpitations and not vomiting   Risk factors: no prior DVT/PE      HPI Comments: Ashley Lane is an otherwise healthy 39 y.o. female who presents to the Emergency Department complaining of "squeezing" left-sided chest pain with left arm numbness, beginning tonight. Her pain has since partially resolved; it worsens with applied pressure. Patient explains she was assissting her daughter with changing clothes and doing dishes when she suddenly felt "unwell." She had an episode of diarrhea, felt lightheaded and "could not see," along with her chest pain. She notes that for the past several weeks she has been feeling fatigued. No treatments or medications tried PTA. She denies new medications, new exercise routines, recent heavy lifting/strain or stress, any recent long-term periods of immobility (e.g. no plane or car trips over 6-8 hours).    Past Medical History  Diagnosis Date  . H/O varicella   . AMA (advanced maternal age) multigravida 35+   . Hx of pyelonephritis   . Anemia     hx   Past Surgical History   Procedure Laterality Date  . No past surgeries     Family History  Problem Relation Age of Onset  . Hypertension Mother   . Thyroid disease Mother   . Cancer Sister     cervical  . Hypertension Maternal Grandmother   . Thyroid disease Maternal Grandmother   . Hypertension Maternal Grandfather    History  Substance Use Topics  . Smoking status: Never Smoker   . Smokeless tobacco: Never Used  . Alcohol Use: No   OB History    Gravida Para Term Preterm AB TAB SAB Ectopic Multiple Living   0 0 0 0 0 0 3     Review of Systems  Constitutional: Positive for fatigue.       Fatigue for months not seen by PMD  Respiratory: Negative for cough and chest tightness.   Cardiovascular: Positive for chest pain. Negative for palpitations and leg swelling.  Gastrointestinal: Positive for diarrhea. Negative for vomiting.  All other systems reviewed and are negative.  Allergies  Latex  Home Medications   Prior to Admission medications   Medication Sig Start Date End Date Taking? Authorizing Provider  vitamin B-12 (CYANOCOBALAMIN) 500 MCG tablet Take 500 mcg by mouth daily.   Yes Historical Provider, MD  Multiple Vitamin (MULTIVITAMIN) tablet Take 1 tablet by mouth daily.    Historical Provider, MD   BP 139/76 mmHg  Pulse 84  Temp(Src) 98.4 F (36.9 C) (Oral)  Resp 16  Ht  (  1.676 m)  Wt 240 lb (108.863 kg)  BMI 38.76 kg/m2  SpO2 100%  LMP 05/14/2014 (Exact Date) Physical Exam  Constitutional: She is oriented to person, place, and time. She appears well-developed and well-nourished. No distress.  HENT:  Head: Normocephalic and atraumatic.  Mouth/Throat: Oropharynx is clear and moist. No oropharyngeal exudate.  Moist mucous membranes  Eyes: EOM are normal. Pupils are equal, round, and reactive to light.  Neck: Normal range of motion. Neck supple. No JVD present.  Cardiovascular: Normal rate, regular rhythm and normal heart sounds.  Exam reveals no gallop and no  friction rub.   No murmur heard. Pulmonary/Chest: Effort normal and breath sounds normal. No respiratory distress. She has no wheezes. She has no rales. She exhibits tenderness.  Abdominal: Soft. She exhibits no mass. There is no tenderness. There is no rebound and no guarding.  Hyperactive bowel sounds  Musculoskeletal: Normal range of motion. She exhibits no edema or tenderness.  Moves all extremities normally. Spasm in left pectoralis muscle.   Lymphadenopathy:    She has no cervical adenopathy.  Neurological: She is alert and oriented to person, place, and time. She displays normal reflexes.  Skin: Skin is warm and dry. No rash noted. She is not diaphoretic.  Psychiatric: She has a normal mood and affect. Her behavior is normal.  Nursing note and vitals reviewed.   ED Course  Procedures   DIAGNOSTIC STUDIES: Oxygen Saturation is 100% on RA, normal by my interpretation.    COORDINATION OF CARE: 12:59 AM Discussed treatment plan with pt at bedside and pt agreed to plan.  Labs Review Labs Reviewed  CBC - Abnormal; Notable for the following:    Platelets 404 (*)    All other components within normal limits  BASIC METABOLIC PANEL - Abnormal; Notable for the following:    Glucose, Bld 151 (*)    All other components within normal limits  TROPONIN I   Imaging Review Dg Chest 2 View  06/02/2014   CLINICAL DATA:  Chest pain and left arm pain.  Blacked out today.  EXAM: CHEST  2 VIEW  COMPARISON:  05/09/2010  FINDINGS: Normal heart size and pulmonary vascularity. No focal airspace disease or consolidation in the lungs. No blunting of costophrenic angles. No pneumothorax. Mediastinal contours appear intact. 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  IMPRESSION: No active cardiopulmonary disease.   Electronically Signed   By: Burman NievesWilliam  Stevens M.D.   On: 06/02/2014 00:22    EKG Interpretation   Date/Time:  Thursday June 01 2014 21:17:25 EST Ventricular Rate:  90 PR Interval:   166 QRS Duration: 102 QT Interval:  352 QTC Calculation: 430 R Axis:   1 Text Interpretation:  Normal sinus rhythm Confirmed by [facility]  [facility],  Azia Toutant ([id number]) on 06/02/2014 12:50:52 AM      EKG Interpretation  Date/Time:  Thursday June 01 2014 21:17:25 EST Ventricular Rate:  90 PR Interval:  166 QRS Duration: 102 QT Interval:  352 QTC Calculation: 430 R Axis:   1 Text Interpretation:  Normal sinus rhythm Confirmed by [facility], Rylie Limburg ([id number]) on 06/02/2014 12:50:52 AM      MDM   Final diagnoses:  None   PERC negative, Well's 0 highly doubt PE as the symptoms are ongoing and constant for days one negative EKG and troponin excludes acs.  Symptoms compatible with MSK pain.  Follow up with your PMD.  Strict return precautions given  I personally performed the services described in this documentation,  which was scribed in my presence. The recorded information has been reviewed and is accurate.       Cy Blamer, MD 06/02/14 [phone]

## 2014-06-02 NOTE — ED Notes (Signed)
C/o chest pain x 2 weeks  Off and w dizziness and sob

## 2014-06-02 NOTE — Discharge Instructions (Signed)

## 2014-09-19 ENCOUNTER — Other Ambulatory Visit (HOSPITAL_COMMUNITY)
Admission: RE | Admit: 2014-09-19 | Discharge: 2014-09-19 | Disposition: A | Discharge status: Home / Self Care | Payer: BLUE CROSS/BLUE SHIELD | Source: Ambulatory Visit | Attending: Gynecology | Admitting: Gynecology

## 2014-09-19 ENCOUNTER — Encounter: Payer: Self-pay | Admitting: Women's Health

## 2014-09-19 ENCOUNTER — Ambulatory Visit (INDEPENDENT_AMBULATORY_CARE_PROVIDER_SITE_OTHER): Payer: BLUE CROSS/BLUE SHIELD | Admitting: Women's Health

## 2014-09-19 VITALS — BP 126/80 | Ht 66.0 in | Wt 245.0 lb

## 2014-09-19 DIAGNOSIS — Z01419 Encounter for gynecological examination (general) (routine) without abnormal findings: Secondary | ICD-10-CM | POA: Diagnosis not present

## 2014-09-19 DIAGNOSIS — F4322 Adjustment disorder with anxiety: Secondary | ICD-10-CM

## 2014-09-19 DIAGNOSIS — S92901S Unspecified fracture of right foot, sequela: Secondary | ICD-10-CM

## 2014-09-19 LAB — COMPREHENSIVE METABOLIC PANEL
ALK PHOS: 60 U/L (ref 39–117)
ALT: 17 U/L (ref 0–35)
AST: 12 U/L (ref 0–37)
Albumin: 4.1 g/dL (ref 3.5–5.2)
BILIRUBIN TOTAL: 0.3 mg/dL (ref 0.2–1.2)
BUN: 9 mg/dL (ref 6–23)
CO2: 25 meq/L (ref 19–32)
CREATININE: 0.63 mg/dL (ref 0.50–1.10)
Calcium: 9.2 mg/dL (ref 8.4–10.5)
Chloride: 101 mEq/L (ref 96–112)
Glucose, Bld: 105 mg/dL — ABNORMAL HIGH (ref 70–99)
Potassium: 4.3 mEq/L (ref 3.5–5.3)
Sodium: 139 mEq/L (ref 135–145)
TOTAL PROTEIN: 7 g/dL (ref 6.0–8.3)

## 2014-09-19 MED ORDER — ALPRAZOLAM 0.25 MG PO TABS: 0.2500 mg | ORAL_TABLET | Freq: Every evening | ORAL | Status: DC | PRN

## 2014-09-19 MED ORDER — VITAMIN D (ERGOCALCIFEROL) 1.25 MG (50000 UNIT) PO CAPS: 50000.0000 [IU] | ORAL_CAPSULE | ORAL | Status: DC

## 2014-09-19 NOTE — Patient Instructions (Signed)
Generalized Anxiety Disorder Generalized anxiety disorder (GAD) is a mental disorder. It interferes with life functions, including relationships, work, and school. GAD is different from normal anxiety, which everyone experiences at some point in their lives in response to specific life events and activities. Normal anxiety actually helps [place] prepare for and get through these life events and activities. Normal anxiety goes away after the event or activity is over.  GAD causes anxiety that is not necessarily related to specific events or activities. It also causes excess anxiety in proportion to specific events or activities. The anxiety associated with GAD is also difficult to control. GAD can vary from mild to severe. People with severe GAD can have intense waves of anxiety with physical symptoms (panic attacks).  SYMPTOMS The anxiety and worry associated with GAD are difficult to control. This anxiety and worry are related to many life events and activities and also occur more days than not for 6 months or longer. People with GAD also have three or more of the following symptoms (one or more in children):  Restlessness.   Fatigue.  Difficulty concentrating.   Irritability.  Muscle tension.  Difficulty sleeping or unsatisfying sleep. DIAGNOSIS GAD is diagnosed through an assessment by your health care provider. Your health care provider will ask you questions aboutyour mood,physical symptoms, and events in your life. Your health care provider may ask you about your medical history and use of alcohol or drugs, including prescription medicines. Your health care provider may also do a physical exam and blood tests. Certain medical conditions and the use of certain substances can cause symptoms similar to those associated with GAD. Your health care provider may refer you to a mental health specialist for further evaluation. TREATMENT The following therapies are usually used to treat GAD:    Medication. Antidepressant medication usually is prescribed for long-term daily control. Antianxiety medicines may be added in severe cases, especially when panic attacks occur.   Talk therapy (psychotherapy). Certain types of talk therapy can be helpful in treating GAD by providing support, education, and guidance. A form of talk therapy called cognitive behavioral therapy can teach you healthy ways to think about and react to daily life events and activities.  Stress managementtechniques. These include yoga, meditation, and exercise and can be very helpful when they are practiced regularly. A mental health specialist can help determine which treatment is best for you. Some people see improvement with one therapy. However, other people require a combination of therapies. Document Released: 07/05/2012 Document Revised: 07/25/2013 Document Reviewed: 07/05/2012 [facility] Patient Information 2015 [place], [place]. This information is not intended to replace advice given to you by your health care provider. Make sure you discuss any questions you have with your health care provider. Health Maintenance Adopting a healthy lifestyle and getting preventive care can go a long way to promote health and wellness. Talk with your health care provider about what schedule of regular examinations is right for you. This is a good chance for you to check in with your provider about disease prevention and staying healthy. In between checkups, there are plenty of things you can do on your own. Experts have done a lot of research about which lifestyle changes and preventive measures are most likely to keep you healthy. Ask your health care provider for more information. WEIGHT AND DIET  Eat a healthy diet  Be sure to include plenty of vegetables, fruits, low-fat dairy products, and lean protein.  Do not eat a lot of foods high  in solid fats, added sugars, or salt.  Get regular exercise. This is one of the most  important things you can do for your health.  Most adults should exercise for at least 150 minutes each week. The exercise should increase your heart rate and make you sweat (moderate-intensity exercise).  Most adults should also do strengthening exercises at least twice a week. This is in addition to the moderate-intensity exercise.  Maintain a healthy weight  Body mass index (BMI) is a measurement that can be used to identify possible weight problems. It estimates body fat based on height and weight. Your health care provider can help determine your BMI and help you achieve or maintain a healthy weight.  For females 91 years of age and older:   A BMI below 18.5 is considered underweight.  A BMI of 18.5 to 24.9 is normal.  A BMI of 25 to 29.9 is considered overweight.  A BMI of 30 and above is considered obese.  Watch levels of cholesterol and blood lipids  You should start having your blood tested for lipids and cholesterol at 38 years of age, then have this test every 5 years.  You may need to have your cholesterol levels checked more often if:  Your lipid or cholesterol levels are high.  You are older than 38 years of age.  You are at high risk for heart disease.  CANCER SCREENING   Lung Cancer  Lung cancer screening is recommended for adults 37-100 years old who are at high risk for lung cancer because of a history of smoking.  A yearly low-dose CT scan of the lungs is recommended for people who:  Currently smoke.  Have quit within the past 15 years.  Have at least a 30-pack-year history of smoking. A pack year is smoking an average of one pack of cigarettes a day for 1 year.  Yearly screening should continue until it has been 15 years since you quit.  Yearly screening should stop if you develop a health problem that would prevent you from having lung cancer treatment.  Breast Cancer  Practice breast self-awareness. This means understanding how your breasts  normally appear and feel.  It also means doing regular breast self-exams. Let your health care provider know about any changes, no matter how small.  If you are in your 20s or 30s, you should have a clinical breast exam (CBE) by a health care provider every 1-3 years as part of a regular health exam.  If you are 55 or older, have a CBE every year. Also consider having a breast X-ray (mammogram) every year.  If you have a family history of breast cancer, talk to your health care provider about genetic screening.  If you are at high risk for breast cancer, talk to your health care provider about having an MRI and a mammogram every year.  Breast cancer gene (BRCA) assessment is recommended for women who have family members with BRCA-related cancers. BRCA-related cancers include:  Breast.  Ovarian.  Tubal.  Peritoneal cancers.  Results of the assessment will determine the need for genetic counseling and BRCA1 and BRCA2 testing. Cervical Cancer Routine pelvic examinations to screen for cervical cancer are no longer recommended for nonpregnant women who are considered low risk for cancer of the pelvic organs (ovaries, uterus, and vagina) and who do not have symptoms. A pelvic examination may be necessary if you have symptoms including those associated with pelvic infections. Ask your health care provider if a screening  pelvic exam is right for you.   The Pap test is the screening test for cervical cancer for women who are considered at risk.  If you had a hysterectomy for a problem that was not cancer or a condition that could lead to cancer, then you no longer need Pap tests.  If you are older than 65 years, and you have had normal Pap tests for the past 10 years, you no longer need to have Pap tests.  If you have had past treatment for cervical cancer or a condition that could lead to cancer, you need Pap tests and screening for cancer for at least 20 years after your treatment.  If you  no longer get a Pap test, assess your risk factors if they change (such as having a new sexual partner). This can affect whether you should start being screened again.  Some women have medical problems that increase their chance of getting cervical cancer. If this is the case for you, your health care provider may recommend more frequent screening and Pap tests.  The human papillomavirus (HPV) test is another test that may be used for cervical cancer screening. The HPV test looks for the virus that can cause cell changes in the cervix. The cells collected during the Pap test can be tested for HPV.  The HPV test can be used to screen women 86 years of age and older. Getting tested for HPV can extend the interval between normal Pap tests from three to five years.  An HPV test also should be used to screen women of any age who have unclear Pap test results.  After 38 years of age, women should have HPV testing as often as Pap tests.  Colorectal Cancer  This type of cancer can be detected and often prevented.  Routine colorectal cancer screening usually begins at 38 years of age and continues through 38 years of age.  Your health care provider may recommend screening at an earlier age if you have risk factors for colon cancer.  Your health care provider may also recommend using home test kits to check for hidden blood in the stool.  A small camera at the end of a tube can be used to examine your colon directly (sigmoidoscopy or colonoscopy). This is done to check for the earliest forms of colorectal cancer.  Routine screening usually begins at age 69.  Direct examination of the colon should be repeated every 5-10 years through 38 years of age. However, you may need to be screened more often if early forms of precancerous polyps or small growths are found. Skin Cancer  Check your skin from head to toe regularly.  Tell your health care provider about any new moles or changes in moles,  especially if there is a change in a mole's shape or color.  Also tell your health care provider if you have a mole that is larger than the size of a pencil eraser.  Always use sunscreen. Apply sunscreen liberally and repeatedly throughout the day.  Protect yourself by wearing long sleeves, pants, a wide-brimmed hat, and sunglasses whenever you are outside. HEART DISEASE, DIABETES, AND HIGH BLOOD PRESSURE   Have your blood pressure checked at least every 1-2 years. High blood pressure causes heart disease and increases the risk of stroke.  If you are between 10 years and 108 years old, ask your health care provider if you should take aspirin to prevent strokes.  Have regular diabetes screenings. This involves taking a blood  sample to check your fasting blood sugar level.  If you are at a normal weight and have a low risk for diabetes, have this test once every three years after 38 years of age.  If you are overweight and have a high risk for diabetes, consider being tested at a younger age or more often. PREVENTING INFECTION  Hepatitis B  If you have a higher risk for hepatitis B, you should be screened for this virus. You are considered at high risk for hepatitis B if:  You were born in a country where hepatitis B is common. Ask your health care provider which countries are considered high risk.  Your parents were born in a high-risk country, and you have not been immunized against hepatitis B (hepatitis B vaccine).  You have HIV or AIDS.  You use needles to inject street drugs.  You live with someone who has hepatitis B.  You have had sex with someone who has hepatitis B.  You get hemodialysis treatment.  You take certain medicines for conditions, including cancer, organ transplantation, and autoimmune conditions. Hepatitis C  Blood testing is recommended for:  Everyone born from [birth year] through 1964.  Anyone with known risk factors for hepatitis C. Sexually transmitted  infections (STIs)  You should be screened for sexually transmitted infections (STIs) including gonorrhea and chlamydia if:  You are sexually active and are younger than 38 years of age.  You are older than 38 years of age and your health care provider tells you that you are at risk for this type of infection.  Your sexual activity has changed since you were last screened and you are at an increased risk for chlamydia or gonorrhea. Ask your health care provider if you are at risk.  If you do not have HIV, but are at risk, it may be recommended that you take a prescription medicine daily to prevent HIV infection. This is called pre-exposure prophylaxis (PrEP). You are considered at risk if:  You are sexually active and do not regularly use condoms or know the HIV status of your partner(s).  You take drugs by injection.  You are sexually active with a partner who has HIV. Talk with your health care provider about whether you are at high risk of being infected with HIV. If you choose to begin PrEP, you should first be tested for HIV. You should then be tested every 3 months for as long as you are taking PrEP.  PREGNANCY   If you are premenopausal and you may become pregnant, ask your health care provider about preconception counseling.  If you may become pregnant, take 400 to 800 micrograms (mcg) of folic acid every day.  If you want to prevent pregnancy, talk to your health care provider about birth control (contraception). OSTEOPOROSIS AND MENOPAUSE   Osteoporosis is a disease in which the bones lose minerals and strength with aging. This can result in serious bone fractures. Your risk for osteoporosis can be identified using a bone density scan.  If you are 52 years of age or older, or if you are at risk for osteoporosis and fractures, ask your health care provider if you should be screened.  Ask your health care provider whether you should take a calcium or vitamin D supplement to  lower your risk for osteoporosis.  Menopause may have certain physical symptoms and risks.  Hormone replacement therapy may reduce some of these symptoms and risks. Talk to your health care provider about whether hormone replacement therapy  is right for you.  HOME CARE INSTRUCTIONS   Schedule regular health, dental, and eye exams.  Stay current with your immunizations.   Do not use any tobacco products including cigarettes, chewing tobacco, or electronic cigarettes.  If you are pregnant, do not drink alcohol.  If you are breastfeeding, limit how much and how often you drink alcohol.  Limit alcohol intake to no more than 1 drink per day for nonpregnant women. One drink equals 12 ounces of beer, 5 ounces of wine, or 1 ounces of hard liquor.  Do not use street drugs.  Do not share needles.  Ask your health care provider for help if you need support or information about quitting drugs.  Tell your health care provider if you often feel depressed.  Tell your health care provider if you have ever been abused or do not feel safe at home. Document Released: 09/23/2010 Document Revised: 07/25/2013 Document Reviewed: 02/09/2013 [facility] Patient Information 2015 [place], [place]. This information is not intended to replace advice given to you by your health care provider. Make sure you discuss any questions you have with your health care provider. Exercise to Stay Healthy Exercise helps you become and stay healthy. EXERCISE IDEAS AND TIPS Choose exercises that:  You enjoy.  Fit into your day. You do not need to exercise really hard to be healthy. You can do exercises at a slow or medium level and stay healthy. You can:  Stretch before and after working out.  Try yoga, Pilates, or tai chi.  Lift weights.  Walk fast, swim, jog, run, climb stairs, bicycle, dance, or rollerskate.  Take aerobic classes. Exercises that burn about 150 calories:  Running 1  miles in 15  minutes.  Playing volleyball for 45 to 60 minutes.  Washing and waxing a car for 45 to 60 minutes.  Playing touch football for 45 minutes.  Walking 1  miles in 35 minutes.  Pushing a stroller 1  miles in 30 minutes.  Playing basketball for 30 minutes.  Raking leaves for 30 minutes.  Bicycling 5 miles in 30 minutes.  Walking 2 miles in 30 minutes.  Dancing for 30 minutes.  Shoveling snow for 15 minutes.  Swimming laps for 20 minutes.  Walking up stairs for 15 minutes.  Bicycling 4 miles in 15 minutes.  Gardening for 30 to 45 minutes.  Jumping rope for 15 minutes.  Washing windows or floors for 45 to 60 minutes. Document Released: 04/12/2010 Document Revised: 06/02/2011 Document Reviewed: 04/12/2010 [facility] Patient Information 2015 [place], [place]. This information is not intended to replace advice given to you by your health care provider. Make sure you discuss any questions you have with your health care provider.

## 2014-09-19 NOTE — Addendum Note (Signed)
Addended by: Kem ParkinsonBARNES, Lyndle Pang on: 09/19/2014 02:01 PM   Modules accepted: Orders

## 2014-09-19 NOTE — Progress Notes (Signed)
Ashley Lane [birth year] [id number]    History:    Presents for annual exam.  Delivered Sadie now 2-1/2 since last office visit in 2014. Has been having unusual chest discomfort, anxiety has had a negative cardiac workup. Blood sugar 151 in March 2016 while at the hospital, vitamin D 23 at primary care last week, normal thyroid function with slightly elevated T4.(TSH 0.9, T4 6 0.9, T3 normal) Having heavy regular monthly 4-5 day cycle using withdrawal and rhythm for contraception. 07/2014 diagnosed with right foot for fractured metatarsal no known injury. Normal Pap history and normal screening mammogram last month.  Past medical history, past surgical history, family history and social history were all reviewed and documented in the EPIC chart. 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  ROS:  A ROS was performed and pertinent positives and negatives are included.  Exam:  Filed Vitals:   09/19/14 1156  BP: 126/80    General appearance:  Normal Thyroid:  Symmetrical, normal in size, without palpable masses or nodularity. Respiratory  Auscultation:  Clear without wheezing or rhonchi Cardiovascular  Auscultation:  Regular rate, without rubs, murmurs or gallops  Edema/varicosities:  Not grossly evident Abdominal  Soft,nontender, without masses, guarding or rebound.  Liver/spleen:  No organomegaly noted  Hernia:  None appreciated  Skin  Inspection:  Grossly normal   Breasts: Examined lying and sitting.     Right: Without masses, retractions, discharge or axillary adenopathy.     Left: Without masses, retractions, discharge or axillary adenopathy. Gentitourinary   Inguinal/mons:  Normal without inguinal adenopathy  External genitalia:  Normal  BUS/Urethra/Skene's glands:  Normal  Vagina:  Normal  Cervix:  Normal  Uterus:   normal in size, shape and contour.  Midline and mobile  Adnexa/parametria:     Rt: Without masses or tenderness.   Lt: Without masses or  tenderness.  Anus and perineum: Normal  Digital rectal exam: Normal sphincter tone without palpated masses or tenderness  Assessment/Plan:  39 y.o. MWF G3P3 for annual exam.     Right foot fracture ( 4 metatarsals) unknown etiology Regular monthly heavy cycle/withdrawal and rhythm contraception Obesity Anxiety/panic Vitamin D deficiency  Plan: Vitamin D 50,000 weekly for 12 weeks and then 2000 and over-the-counter daily. CMP, Hemoglobin A1c, TSH, thyroid antibodies, T4, UA, Pap with HR HPV typing, new screening guidelines reviewed. DEXA, will schedule here. Currently in counseling at church will continue, Xanax 0.25 when necessary reviewed importance of using sparingly, if chest discomfort persists follow-up ER or cardiologist. SBE's, increase regular exercise and decrease calories for weight loss, calcium rich diet.   Harrington ChallengerYOUNG,NANCY J WHNP, 1:21 PM 09/19/2014

## 2014-09-20 LAB — HEMOGLOBIN A1C
Hgb A1c MFr Bld: 6 % — ABNORMAL HIGH (ref <5.7)
MEAN PLASMA GLUCOSE: 126 mg/dL — AB (ref <117)

## 2014-09-20 LAB — THYROID PEROXIDASE ANTIBODY: Thyroperoxidase Ab SerPl-aCnc: 1 IU/mL (ref <9)

## 2014-09-20 LAB — TSH: TSH: 0.838 u[IU]/mL (ref 0.350–4.500)

## 2014-09-21 LAB — CYTOLOGY - PAP

## 2014-10-02 ENCOUNTER — Ambulatory Visit: Payer: Self-pay | Admitting: Gynecology

## 2014-11-06 DIAGNOSIS — G4733 Obstructive sleep apnea (adult) (pediatric): Secondary | ICD-10-CM | POA: Insufficient documentation

## 2014-11-21 ENCOUNTER — Ambulatory Visit: Payer: BLUE CROSS/BLUE SHIELD | Admitting: Gastroenterology

## 2015-04-20 ENCOUNTER — Telehealth: Payer: Self-pay

## 2015-04-20 NOTE — Telephone Encounter (Signed)
Patient called because she has found a lump at the base of her neck near her clavicle. It has been there approximately a month that she has been aware of.  She asked if Dr. Velvet Bathe would see her for that. She does have a PCP.  I told her that Dr. Velvet Bathe could certainly examine the area and assess it but may need to direct her to other provider for follow up or testing. She said she wants to check with her PCP first and if they can see her relatively soon she will schedule there otherwise she might want Dr. Velvet Bathe to take a look at it for his opinion. She will call prn.

## 2015-11-15 ENCOUNTER — Encounter: Payer: Self-pay | Admitting: Women's Health

## 2015-11-15 ENCOUNTER — Other Ambulatory Visit: Payer: Self-pay | Admitting: Women's Health

## 2015-11-15 ENCOUNTER — Ambulatory Visit (INDEPENDENT_AMBULATORY_CARE_PROVIDER_SITE_OTHER): Payer: BLUE CROSS/BLUE SHIELD | Admitting: Women's Health

## 2015-11-15 VITALS — BP 120/70 | Ht 66.0 in | Wt 246.0 lb

## 2015-11-15 DIAGNOSIS — Z01419 Encounter for gynecological examination (general) (routine) without abnormal findings: Secondary | ICD-10-CM | POA: Diagnosis not present

## 2015-11-15 DIAGNOSIS — N946 Dysmenorrhea, unspecified: Secondary | ICD-10-CM

## 2015-11-15 DIAGNOSIS — Z8632 Personal history of gestational diabetes: Secondary | ICD-10-CM

## 2015-11-15 MED ORDER — IBUPROFEN 600 MG PO TABS: 600.0000 mg | ORAL_TABLET | Freq: Three times a day (TID) | ORAL | 1 refills | Status: DC | PRN

## 2015-11-15 NOTE — Patient Instructions (Signed)
Health Maintenance, Female Adopting a healthy lifestyle and getting preventive care can go a long way to promote health and wellness. Talk with your health care provider about what schedule of regular examinations is right for you. This is a good chance for you to check in with your provider about disease prevention and staying healthy. In between checkups, there are plenty of things you can do on your own. Experts have done a lot of research about which lifestyle changes and preventive measures are most likely to keep you healthy. Ask your health care provider for more information. WEIGHT AND DIET  Eat a healthy diet  Be sure to include plenty of vegetables, fruits, low-fat dairy products, and lean protein.  Do not eat a lot of foods high in solid fats, added sugars, or salt.  Get regular exercise. This is one of the most important things you can do for your health.  Most adults should exercise for at least 150 minutes each week. The exercise should increase your heart rate and make you sweat (moderate-intensity exercise).  Most adults should also do strengthening exercises at least twice a week. This is in addition to the moderate-intensity exercise.  Maintain a healthy weight  Body mass index (BMI) is a measurement that can be used to identify possible weight problems. It estimates body fat based on height and weight. Your health care provider can help determine your BMI and help you achieve or maintain a healthy weight.  For females 20 years of age and older:   A BMI below 18.5 is considered underweight.  A BMI of 18.5 to 24.9 is normal.  A BMI of 25 to 29.9 is considered overweight.  A BMI of 30 and above is considered obese.  Watch levels of cholesterol and blood lipids  You should start having your blood tested for lipids and cholesterol at 39 years of age, then have this test every 5 years.  You may need to have your cholesterol levels checked more often if:  Your lipid  or cholesterol levels are high.  You are older than 39 years of age.  You are at high risk for heart disease.  CANCER SCREENING   Lung Cancer  Lung cancer screening is recommended for adults 55-80 years old who are at high risk for lung cancer because of a history of smoking.  A yearly low-dose CT scan of the lungs is recommended for people who:  Currently smoke.  Have quit within the past 15 years.  Have at least a 30-pack-year history of smoking. A pack year is smoking an average of one pack of cigarettes a day for 1 year.  Yearly screening should continue until it has been 15 years since you quit.  Yearly screening should stop if you develop a health problem that would prevent you from having lung cancer treatment.  Breast Cancer  Practice breast self-awareness. This means understanding how your breasts normally appear and feel.  It also means doing regular breast self-exams. Let your health care provider know about any changes, no matter how small.  If you are in your 20s or 30s, you should have a clinical breast exam (CBE) by a health care provider every 1-3 years as part of a regular health exam.  If you are 40 or older, have a CBE every year. Also consider having a breast X-ray (mammogram) every year.  If you have a family history of breast cancer, talk to your health care provider about genetic screening.  If you   are at high risk for breast cancer, talk to your health care provider about having an MRI and a mammogram every year.  Breast cancer gene (BRCA) assessment is recommended for women who have family members with BRCA-related cancers. BRCA-related cancers include:  Breast.  Ovarian.  Tubal.  Peritoneal cancers.  Results of the assessment will determine the need for genetic counseling and BRCA1 and BRCA2 testing. Cervical Cancer Your health care provider may recommend that you be screened regularly for cancer of the pelvic organs (ovaries, uterus, and  vagina). This screening involves a pelvic examination, including checking for microscopic changes to the surface of your cervix (Pap test). You may be encouraged to have this screening done every 3 years, beginning at age 21.  For women ages 30-65, health care providers may recommend pelvic exams and Pap testing every 3 years, or they may recommend the Pap and pelvic exam, combined with testing for human papilloma virus (HPV), every 5 years. Some types of HPV increase your risk of cervical cancer. Testing for HPV may also be done on women of any age with unclear Pap test results.  Other health care providers may not recommend any screening for nonpregnant women who are considered low risk for pelvic cancer and who do not have symptoms. Ask your health care provider if a screening pelvic exam is right for you.  If you have had past treatment for cervical cancer or a condition that could lead to cancer, you need Pap tests and screening for cancer for at least 20 years after your treatment. If Pap tests have been discontinued, your risk factors (such as having a new sexual partner) need to be reassessed to determine if screening should resume. Some women have medical problems that increase the chance of getting cervical cancer. In these cases, your health care provider may recommend more frequent screening and Pap tests. Colorectal Cancer  This type of cancer can be detected and often prevented.  Routine colorectal cancer screening usually begins at 39 years of age and continues through 39 years of age.  Your health care provider may recommend screening at an earlier age if you have risk factors for colon cancer.  Your health care provider may also recommend using home test kits to check for hidden blood in the stool.  A small camera at the end of a tube can be used to examine your colon directly (sigmoidoscopy or colonoscopy). This is done to check for the earliest forms of colorectal  cancer.  Routine screening usually begins at age 50.  Direct examination of the colon should be repeated every 5-10 years through 39 years of age. However, you may need to be screened more often if early forms of precancerous polyps or small growths are found. Skin Cancer  Check your skin from head to toe regularly.  Tell your health care provider about any new moles or changes in moles, especially if there is a change in a mole's shape or color.  Also tell your health care provider if you have a mole that is larger than the size of a pencil eraser.  Always use sunscreen. Apply sunscreen liberally and repeatedly throughout the day.  Protect yourself by wearing long sleeves, pants, a wide-brimmed hat, and sunglasses whenever you are outside. HEART DISEASE, DIABETES, AND HIGH BLOOD PRESSURE   High blood pressure causes heart disease and increases the risk of stroke. High blood pressure is more likely to develop in:  People who have blood pressure in the high end   of the normal range (130-139/85-89 mm Hg).  People who are overweight or obese.  People who are African American.  If you are 38-23 years of age, have your blood pressure checked every 3-5 years. If you are 61 years of age or older, have your blood pressure checked every year. You should have your blood pressure measured twice--once when you are at a hospital or clinic, and once when you are not at a hospital or clinic. Record the average of the two measurements. To check your blood pressure when you are not at a hospital or clinic, you can use:  An automated blood pressure machine at a pharmacy.  A home blood pressure monitor.  If you are between 45 years and 39 years old, ask your health care provider if you should take aspirin to prevent strokes.  Have regular diabetes screenings. This involves taking a blood sample to check your fasting blood sugar level.  If you are at a normal weight and have a low risk for diabetes,  have this test once every three years after 39 years of age.  If you are overweight and have a high risk for diabetes, consider being tested at a younger age or more often. PREVENTING INFECTION  Hepatitis B  If you have a higher risk for hepatitis B, you should be screened for this virus. You are considered at high risk for hepatitis B if:  You were born in a country where hepatitis B is common. Ask your health care provider which countries are considered high risk.  Your parents were born in a high-risk country, and you have not been immunized against hepatitis B (hepatitis B vaccine).  You have HIV or AIDS.  You use needles to inject street drugs.  You live with someone who has hepatitis B.  You have had sex with someone who has hepatitis B.  You get hemodialysis treatment.  You take certain medicines for conditions, including cancer, organ transplantation, and autoimmune conditions. Hepatitis C  Blood testing is recommended for:  Everyone born from [birth year] through 1964.  Anyone with known risk factors for hepatitis C. Sexually transmitted infections (STIs)  You should be screened for sexually transmitted infections (STIs) including gonorrhea and chlamydia if:  You are sexually active and are younger than 39 years of age.  You are older than 39 years of age and your health care provider tells you that you are at risk for this type of infection.  Your sexual activity has changed since you were last screened and you are at an increased risk for chlamydia or gonorrhea. Ask your health care provider if you are at risk.  If you do not have HIV, but are at risk, it may be recommended that you take a prescription medicine daily to prevent HIV infection. This is called pre-exposure prophylaxis (PrEP). You are considered at risk if:  You are sexually active and do not regularly use condoms or know the HIV status of your partner(s).  You take drugs by injection.  You are sexually  active with a partner who has HIV. Talk with your health care provider about whether you are at high risk of being infected with HIV. If you choose to begin PrEP, you should first be tested for HIV. You should then be tested every 3 months for as long as you are taking PrEP.  PREGNANCY   If you are premenopausal and you may become pregnant, ask your health care provider about preconception counseling.  If you may  become pregnant, take 400 to 800 micrograms (mcg) of folic acid every day.  If you want to prevent pregnancy, talk to your health care provider about birth control (contraception). OSTEOPOROSIS AND MENOPAUSE   Osteoporosis is a disease in which the bones lose minerals and strength with aging. This can result in serious bone fractures. Your risk for osteoporosis can be identified using a bone density scan.  If you are 60 years of age or older, or if you are at risk for osteoporosis and fractures, ask your health care provider if you should be screened.  Ask your health care provider whether you should take a calcium or vitamin D supplement to lower your risk for osteoporosis.  Menopause may have certain physical symptoms and risks.  Hormone replacement therapy may reduce some of these symptoms and risks. Talk to your health care provider about whether hormone replacement therapy is right for you.  HOME CARE INSTRUCTIONS   Schedule regular health, dental, and eye exams.  Stay current with your immunizations.   Do not use any tobacco products including cigarettes, chewing tobacco, or electronic cigarettes.  If you are pregnant, do not drink alcohol.  If you are breastfeeding, limit how much and how often you drink alcohol.  Limit alcohol intake to no more than 1 drink per day for nonpregnant women. One drink equals 12 ounces of beer, 5 ounces of wine, or 1 ounces of hard liquor.  Do not use street drugs.  Do not share needles.  Ask your health care provider for help if  you need support or information about quitting drugs.  Tell your health care provider if you often feel depressed.  Tell your health care provider if you have ever been abused or do not feel safe at home.   This information is not intended to replace advice given to you by your health care provider. Make sure you discuss any questions you have with your health care provider.   Document Released: 09/23/2010 Document Revised: 03/31/2014 Document Reviewed: 02/09/2013 Elsevier Interactive Patient Education 2016 Elsevier Inc. Human Papillomavirus Quadrivalent Vaccine suspension for injection What is this medicine? HUMAN PAPILLOMAVIRUS VACCINE (HYOO muhn pap uh LOH muh vahy ruhs vak SEEN) is a vaccine. It is used to prevent infections of four types of the human papillomavirus. In women, the vaccine may lower your risk of getting cervical, vaginal, vulvar, or anal cancer and genital warts. In men, the vaccine may lower your risk of getting genital warts and anal cancer. You cannot get these diseases from the vaccine. This vaccine does not treat these diseases. This medicine may be used for other purposes; ask your health care provider or pharmacist if you have questions. What should I tell my health care provider before I take this medicine? They need to know if you have any of these conditions: -fever or infection -hemophilia -HIV infection or AIDS -immune system problems -low platelet count -an unusual reaction to Human Papillomavirus Vaccine, yeast, other medicines, foods, dyes, or preservatives -pregnant or trying to get pregnant -breast-feeding How should I use this medicine? This vaccine is for injection in a muscle on your upper arm or thigh. It is given by a health care professional. Dennis Bast will be observed for 15 minutes after each dose. Sometimes, fainting happens after the vaccine is given. You may be asked to sit or lie down during the 15 minutes. Three doses are given. The second dose is  given 2 months after the first dose. The last dose is given  4 months after the second dose. A copy of a Vaccine Information Statement will be given before each vaccination. Read this sheet carefully each time. The sheet may change frequently. Talk to your pediatrician regarding the use of this medicine in children. While this drug may be prescribed for children as young as 55 years of age for selected conditions, precautions do apply. Overdosage: If you think you have taken too much of this medicine contact a poison control center or emergency room at once. NOTE: This medicine is only for you. Do not share this medicine with others. What if I miss a dose? All 3 doses of the vaccine should be given within 6 months. Remember to keep appointments for follow-up doses. Your health care provider will tell you when to return for the next vaccine. Ask your health care professional for advice if you are unable to keep an appointment or miss a scheduled dose. What may interact with this medicine? -other vaccines This list may not describe all possible interactions. Give your health care provider a list of all the medicines, herbs, non-prescription drugs, or dietary supplements you use. Also tell them if you smoke, drink alcohol, or use illegal drugs. Some items may interact with your medicine. What should I watch for while using this medicine? This vaccine may not fully protect everyone. Continue to have regular pelvic exams and cervical or anal cancer screenings as directed by your doctor. The Human Papillomavirus is a sexually transmitted disease. It can be passed by any kind of sexual activity that involves genital contact. The vaccine works best when given before you have any contact with the virus. Many people who have the virus do not have any signs or symptoms. Tell your doctor or health care professional if you have any reaction or unusual symptom after getting the vaccine. What side effects may I notice  from receiving this medicine? Side effects that you should report to your doctor or health care professional as soon as possible: -allergic reactions like skin rash, itching or hives, swelling of the face, lips, or tongue -breathing problems -feeling faint or lightheaded, falls Side effects that usually do not require medical attention (report to your doctor or health care professional if they continue or are bothersome): -cough -dizziness -fever -headache -nausea -redness, warmth, swelling, pain, or itching at site where injected This list may not describe all possible side effects. Call your doctor for medical advice about side effects. You may report side effects to FDA at 1-800-FDA-1088. Where should I keep my medicine? This drug is given in a hospital or clinic and will not be stored at home. NOTE: This sheet is a summary. It may not cover all possible information. If you have questions about this medicine, talk to your doctor, pharmacist, or health care provider.    2016, Elsevier/Gold Standard. (2013-05-02 13:14:33)

## 2015-11-15 NOTE — Progress Notes (Signed)
Ashley PalmChristina Lane [birth year] [id number]    History:    Presents for annual exam. Monthly cycle using rhythm method for contraception. Reports cycles getting heavier needing to change every hour first day. Primary care managing labs. Normal Pap history has not had a mammogram. Gestational diabetes third pregnancy.  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 Husband was a Psychologist, educationalprofessional baseball player now is a Visual merchandiserfarmer.  ROS:  A ROS was performed and pertinent positives and negatives are included.  Exam:  Vitals:   11/15/15 1123  BP: 120/70  Weight: 246 lb (111.6 kg)  Height: 5\' 6"  (1.676 m)   Body mass index is 39.71 kg/m.   General appearance:  Normal Thyroid:  Symmetrical, normal in size, without palpable masses or nodularity. Respiratory  Auscultation:  Clear without wheezing or rhonchi Cardiovascular  Auscultation:  Regular rate, without rubs, murmurs or gallops  Edema/varicosities:  Not grossly evident Abdominal  Soft,nontender, without masses, guarding or rebound.  Liver/spleen:  No organomegaly noted  Hernia:  None appreciated  Skin  Inspection:  Grossly normal   Breasts: Examined lying and sitting.     Right: Without masses, retractions, discharge or axillary adenopathy.     Left: Without masses, retractions, discharge or axillary adenopathy. Gentitourinary   Inguinal/mons:  Normal without inguinal adenopathy  External genitalia:  Normal  BUS/Urethra/Skene's glands:  Normal  Vagina:  Normal  Cervix:  Normal  Uterus:  normal in size, shape and contour.  Midline and mobile  Adnexa/parametria:     Rt: Without masses or tenderness.   Lt: Without masses or tenderness.  Anus and perineum: Normal  Digital rectal exam: Normal sphincter tone without palpated masses or tenderness  Assessment/Plan:  40 y.o. MWF G3 P3  for  annual exam.   Monthly cycle with menorrhagia using rhythm Obesity Labs-primary care  Plan: Contraception options reviewed and declined will continue rhythm, husband planning vasectomy. Options for menorrhagia reviewed declines OCs or Mirena IUD will try Motrin 600 mg every 6-8 hours with cycles. SBE's, exercise, calcium rich diet, MVI daily encouraged. Screening mammogram at 41. Encouraged to increase regular exercise and decrease calories for weight loss UA, Pap normal 2016, new screening guidelines reviewed.    Harrington ChallengerYOUNG,Tameisha Covell J WHNP, 1:09 PM 11/15/2015

## 2015-11-16 LAB — URINALYSIS W MICROSCOPIC + REFLEX CULTURE
Bacteria, UA: NONE SEEN [HPF]
Bilirubin Urine: NEGATIVE
Casts: NONE SEEN [LPF]
Crystals: NONE SEEN [HPF]
Glucose, UA: NEGATIVE
Hgb urine dipstick: NEGATIVE
Ketones, ur: NEGATIVE
Leukocytes, UA: NEGATIVE
Nitrite: NEGATIVE
Protein, ur: NEGATIVE
SPECIFIC GRAVITY, URINE: 1.025 (ref 1.001–1.035)
WBC UA: NONE SEEN WBC/HPF (ref <=5)
Yeast: NONE SEEN [HPF]
pH: 6.5 (ref 5.0–8.0)

## 2015-11-18 LAB — URINE CULTURE

## 2015-11-23 ENCOUNTER — Other Ambulatory Visit: Payer: Self-pay | Admitting: Women's Health

## 2015-11-23 MED ORDER — SULFAMETHOXAZOLE-TRIMETHOPRIM 800-160 MG PO TABS: 1.0000 | ORAL_TABLET | Freq: Two times a day (BID) | ORAL | 0 refills | Status: DC

## 2015-12-03 ENCOUNTER — Telehealth: Payer: Self-pay | Admitting: *Deleted

## 2015-12-03 MED ORDER — CIPROFLOXACIN HCL 250 MG PO TABS: 250.0000 mg | ORAL_TABLET | Freq: Two times a day (BID) | ORAL | 0 refills | Status: DC

## 2015-12-03 NOTE — Telephone Encounter (Signed)
(  you are back up MD) Pt was prescribed Septra twice daily for 3 days #6 on 11/23/15, completed Rx, c/o still having symptoms bladder discomfort and frequency lower back discomfort as well. Please advise

## 2015-12-03 NOTE — Telephone Encounter (Signed)
Recommend ciprofloxacin 250 mg twice a day 5 days 

## 2015-12-03 NOTE — Telephone Encounter (Signed)
Left on pt voicemail Rx sent to pharmacy with directions

## 2016-01-28 IMAGING — DX DG CHEST 2V
2 series · 2 of 2 positions shown · non-contrast
Comparison: 05/09/2010

CLINICAL DATA: Chest pain and left arm pain.  Blacked out today.

EXAM:
CHEST  2 VIEW

[chest pa]
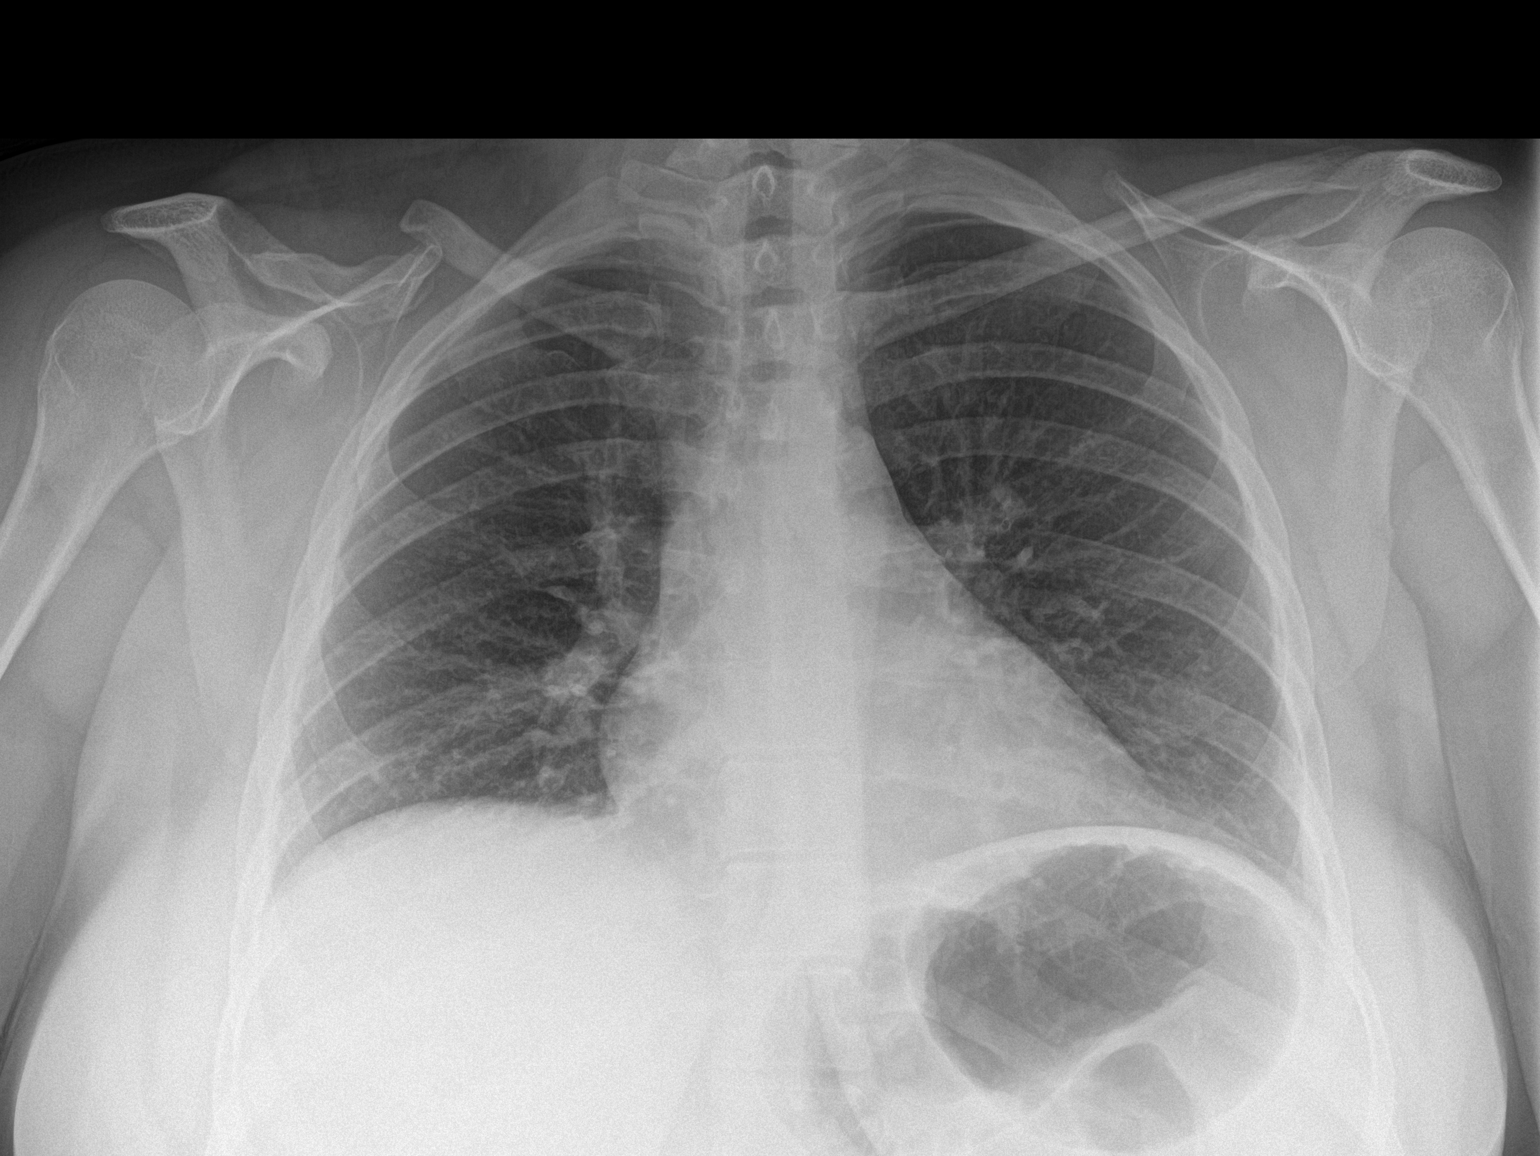

[chest lat]
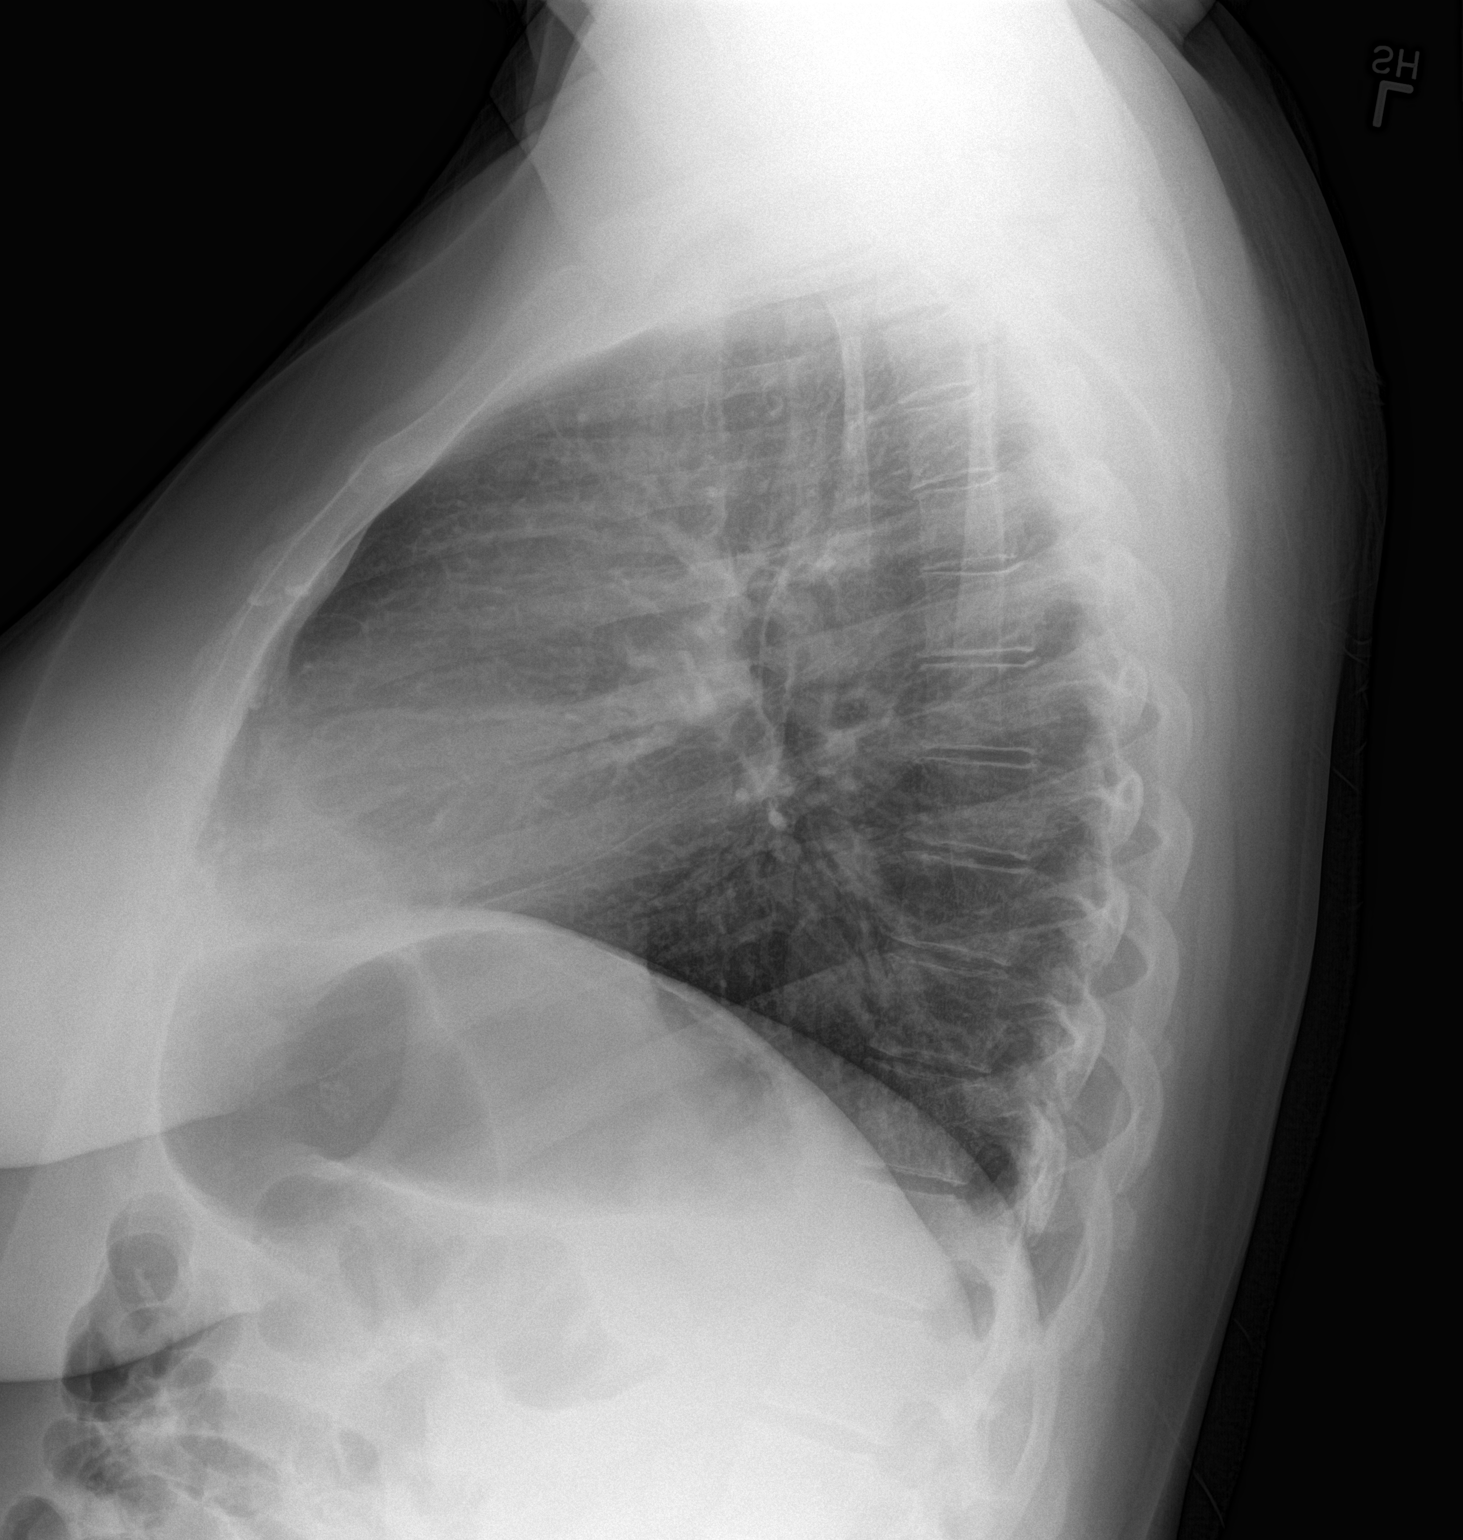

[2 of 2 positions shown; findings below may reference images not displayed]

FINDINGS: Normal heart size and pulmonary vascularity. No focal airspace
disease or consolidation in the lungs. No blunting of costophrenic
angles. No pneumothorax. Mediastinal contours appear intact. Old
ununited fracture of the right clavicle.
IMPRESSION: No active cardiopulmonary disease.

## 2016-03-21 ENCOUNTER — Ambulatory Visit (INDEPENDENT_AMBULATORY_CARE_PROVIDER_SITE_OTHER): Payer: BLUE CROSS/BLUE SHIELD | Admitting: Gynecology

## 2016-03-21 ENCOUNTER — Encounter: Payer: Self-pay | Admitting: Gynecology

## 2016-03-21 VITALS — BP 128/80 | Ht 66.0 in | Wt 246.0 lb

## 2016-03-21 DIAGNOSIS — N644 Mastodynia: Secondary | ICD-10-CM

## 2016-03-21 NOTE — Progress Notes (Signed)
    Ashley PalmChristina Lane 19-Feb-1976 [id number]        40 y.o.  G3P3003 presents complaining of right breast tenderness. Notes over the past week or so a tender area at the periphery of her right breast. Also some nodularity in this area.  About 2 weeks ago she had a small pustule in the periareolar area which came to ahead and drained and is now resolving. The tender area though is at the periphery. No history of same before. Mammogram 2 years ago  Past medical history,surgical history, problem list, medications, allergies, family history and social history were all reviewed and documented in the EPIC chart.  Directed ROS with pertinent positives and negatives documented in the history of present illness/assessment and plan.  Exam: Ashley MantisKim Lane assistant Vitals:   03/21/16 1359  BP: 128/80  Weight: 246 lb (111.6 kg)  Height: 5\' 6"  (1.676 m)   General appearance:  Normal Both breast examined lying and sitting. Left without masses retractions discharge adenopathy. Right with small healing area 5:00 position of the areola consistent with old pustule. Some nodularity at the periphery of the breast at 5:00. No definitive masses but increased tissue density. No overlying skin changes nipple discharge or axillary adenopathy  Assessment/Plan:  40 y.o. G3P3003 with breast tenderness and nodularity at the 5:00 periphery of the breast. No definitive masses but increased tissue density noted. Questionable fibrocystic changes versus otherwise. We'll start with diagnostic mammography and ultrasound. Patient knows to expect a phone call to schedule this within the next week. If normal then patient will monitor and assuming the discomfort resolved and will follow. If persistent then she'll represent for further evaluation. If any abnormalities on the studies then will address them appropriately.    Ashley Lane P MD, 2:10 PM 03/21/2016

## 2016-03-21 NOTE — Patient Instructions (Signed)
The mammogram facility should call you to for the mammogram and ultrasound. If you do not hear from them within the next week call my office.

## 2016-03-25 ENCOUNTER — Telehealth: Payer: Self-pay | Admitting: *Deleted

## 2016-03-25 DIAGNOSIS — N644 Mastodynia: Secondary | ICD-10-CM

## 2016-03-25 NOTE — Telephone Encounter (Signed)
Orders placed at breast center they will contact pt to schedule. 

## 2016-03-25 NOTE — Telephone Encounter (Signed)
-----   Message from Dara Lordsimothy P Fontaine, MD sent at 03/21/2016  2:14 PM EST ----- Schedule diagnostic mammography and ultrasound right breast reference new onset breast tenderness and nodularity at the 5:00 periphery

## 2016-03-27 NOTE — Telephone Encounter (Signed)
Appointment on 04/01/16 @ 10:40am

## 2016-04-01 ENCOUNTER — Ambulatory Visit
Admission: RE | Admit: 2016-04-01 | Discharge: 2016-04-01 | Disposition: A | Discharge status: Home / Self Care | Payer: BLUE CROSS/BLUE SHIELD | Source: Ambulatory Visit | Attending: Gynecology | Admitting: Gynecology

## 2016-04-01 DIAGNOSIS — N644 Mastodynia: Secondary | ICD-10-CM

## 2016-10-24 ENCOUNTER — Encounter: Payer: Self-pay | Admitting: Gynecology

## 2016-10-24 ENCOUNTER — Ambulatory Visit (INDEPENDENT_AMBULATORY_CARE_PROVIDER_SITE_OTHER): Payer: BLUE CROSS/BLUE SHIELD | Admitting: Gynecology

## 2016-10-24 VITALS — BP 122/80

## 2016-10-24 DIAGNOSIS — N912 Amenorrhea, unspecified: Secondary | ICD-10-CM | POA: Diagnosis not present

## 2016-10-24 DIAGNOSIS — N3000 Acute cystitis without hematuria: Secondary | ICD-10-CM | POA: Diagnosis not present

## 2016-10-24 LAB — URINALYSIS W MICROSCOPIC + REFLEX CULTURE
Bilirubin Urine: NEGATIVE
Casts: NONE SEEN [LPF]
Crystals: NONE SEEN [HPF]
Glucose, UA: NEGATIVE
HGB URINE DIPSTICK: NEGATIVE
KETONES UR: NEGATIVE
Leukocytes, UA: NEGATIVE
NITRITE: NEGATIVE
PH: 7 (ref 5.0–8.0)
Protein, ur: NEGATIVE
RBC / HPF: NONE SEEN RBC/HPF (ref <=2)
SPECIFIC GRAVITY, URINE: 1.025 (ref 1.001–1.035)
WBC UA: NONE SEEN WBC/HPF (ref <=5)
Yeast: NONE SEEN [HPF]

## 2016-10-24 LAB — PREGNANCY, URINE: PREG TEST UR: NEGATIVE

## 2016-10-24 MED ORDER — NITROFURANTOIN MONOHYD MACRO 100 MG PO CAPS: 100.0000 mg | ORAL_CAPSULE | Freq: Two times a day (BID) | ORAL | 0 refills | Status: DC

## 2016-10-24 MED ORDER — SULFAMETHOXAZOLE-TRIMETHOPRIM 800-160 MG PO TABS: 1.0000 | ORAL_TABLET | Freq: Two times a day (BID) | ORAL | 0 refills | Status: DC

## 2016-10-24 NOTE — Progress Notes (Signed)
    Ashley Lane [birth year] [id number]        41 y.o.  G3P3003 presents with several day history of urinary frequency, dysuria and low back pain. Also with some urgency. No fever or chills. No vaginal symptoms such as discharge, irritation or odor. Having regular menses using rhythm/barrier contraception  Past medical history,surgical history, problem list, medications, allergies, family history and social history were all reviewed and documented in the EPIC chart.  Directed ROS with pertinent positives and negatives documented in the history of present illness/assessment and plan.  Exam: Vitals:   10/24/16 1051  BP: 122/80   General appearance:  Normal Spine straight without CVA tenderness Abdomen soft nontender without masses guarding rebound  Assessment/Plan:  41 y.o. [id number] with history and urinalysis consistent with UTI. Will treat with Macrobid 100 mg 3 times a day 7 days. Follow up if symptoms persist, worsen or recur.     Dara LordsFONTAINE,Akyia Borelli P MD, 11:19 AM 10/24/2016

## 2016-10-24 NOTE — Addendum Note (Signed)
Addended by: Kem ParkinsonBARNES, Deleah Tison on: 10/24/2016 12:32 PM   Modules accepted: Orders

## 2016-10-24 NOTE — Patient Instructions (Signed)
Take antibiotic twice daily for 7 days. Follow up if your symptoms persist, worsen or recur. 

## 2016-10-25 LAB — URINE CULTURE: Organism ID, Bacteria: NO GROWTH

## 2016-11-03 ENCOUNTER — Encounter (HOSPITAL_COMMUNITY): Payer: Self-pay | Admitting: Emergency Medicine

## 2016-11-03 ENCOUNTER — Emergency Department (HOSPITAL_COMMUNITY)
Admission: EM | Admit: 2016-11-03 | Discharge: 2016-11-03 | Disposition: A | Discharge status: Home / Self Care | Payer: BLUE CROSS/BLUE SHIELD | Attending: Emergency Medicine | Admitting: Emergency Medicine

## 2016-11-03 ENCOUNTER — Emergency Department (HOSPITAL_COMMUNITY): Payer: BLUE CROSS/BLUE SHIELD

## 2016-11-03 DIAGNOSIS — R109 Unspecified abdominal pain: Secondary | ICD-10-CM

## 2016-11-03 DIAGNOSIS — R1032 Left lower quadrant pain: Secondary | ICD-10-CM | POA: Insufficient documentation

## 2016-11-03 DIAGNOSIS — Z91040 Latex allergy status: Secondary | ICD-10-CM | POA: Diagnosis not present

## 2016-11-03 LAB — CBC
HEMATOCRIT: 35 % — AB (ref 36.0–46.0)
HEMOGLOBIN: 11.6 g/dL — AB (ref 12.0–15.0)
MCH: 27.5 pg (ref 26.0–34.0)
MCHC: 33.1 g/dL (ref 30.0–36.0)
MCV: 82.9 fL (ref 78.0–100.0)
Platelets: 395 10*3/uL (ref 150–400)
RBC: 4.22 MIL/uL (ref 3.87–5.11)
RDW: 12.8 % (ref 11.5–15.5)
WBC: 9.2 10*3/uL (ref 4.0–10.5)

## 2016-11-03 LAB — URINALYSIS, ROUTINE W REFLEX MICROSCOPIC
BILIRUBIN URINE: NEGATIVE
Glucose, UA: NEGATIVE mg/dL
HGB URINE DIPSTICK: NEGATIVE
KETONES UR: NEGATIVE mg/dL
Leukocytes, UA: NEGATIVE
Nitrite: NEGATIVE
PH: 6 (ref 5.0–8.0)
Protein, ur: NEGATIVE mg/dL
SPECIFIC GRAVITY, URINE: 1.014 (ref 1.005–1.030)

## 2016-11-03 LAB — COMPREHENSIVE METABOLIC PANEL
ALBUMIN: 3.8 g/dL (ref 3.5–5.0)
ALK PHOS: 57 U/L (ref 38–126)
ALT: 36 U/L (ref 14–54)
ANION GAP: 7 (ref 5–15)
AST: 26 U/L (ref 15–41)
BILIRUBIN TOTAL: 0.5 mg/dL (ref 0.3–1.2)
BUN: 15 mg/dL (ref 6–20)
CALCIUM: 8.9 mg/dL (ref 8.9–10.3)
CO2: 25 mmol/L (ref 22–32)
Chloride: 103 mmol/L (ref 101–111)
Creatinine, Ser: 0.93 mg/dL (ref 0.44–1.00)
GFR calc Af Amer: >60 mL/min (ref >60)
GFR calc non Af Amer: >60 mL/min (ref >60)
GLUCOSE: 149 mg/dL — AB (ref 65–99)
POTASSIUM: 3.8 mmol/L (ref 3.5–5.1)
SODIUM: 135 mmol/L (ref 135–145)
TOTAL PROTEIN: 7.1 g/dL (ref 6.5–8.1)

## 2016-11-03 LAB — LIPASE, BLOOD: Lipase: 27 U/L (ref 11–51)

## 2016-11-03 LAB — POC URINE PREG, ED: Preg Test, Ur: NEGATIVE

## 2016-11-03 MED ORDER — IOPAMIDOL (ISOVUE-300) INJECTION 61%
INTRAVENOUS | Status: AC
  Administered 2016-11-03: 100 mL
  Filled 2016-11-03: qty 100

## 2016-11-03 MED ORDER — IOPAMIDOL (ISOVUE-300) INJECTION 61%
INTRAVENOUS | Status: AC
  Filled 2016-11-03: qty 30

## 2016-11-03 MED ORDER — POLYETHYLENE GLYCOL 3350 17 G PO PACK: 17.0000 g | PACK | Freq: Every day | ORAL | 0 refills | Status: DC

## 2016-11-03 MED ORDER — PHENAZOPYRIDINE HCL 200 MG PO TABS: 200.0000 mg | ORAL_TABLET | Freq: Three times a day (TID) | ORAL | 0 refills | Status: DC

## 2016-11-03 NOTE — ED Notes (Signed)
Patient transported to CT 

## 2016-11-03 NOTE — ED Notes (Signed)
Pt departed in NAD, refused use of wheelchair.  

## 2016-11-03 NOTE — Discharge Instructions (Signed)
Your CT scan is reassuring. You declined pelvic exam today. Follow-up with her gynecologist. Take the medication for constipation as prescribed. Return to the ED if you develop new or worsening symptoms.

## 2016-11-03 NOTE — ED Triage Notes (Signed)
Patient with treatment of recent UTI.  Patient finished her antibiotics, but now with lower back pain, abdominal pain and it is worse than before.  No nausea or vomiting.

## 2016-11-03 NOTE — ED Notes (Signed)
Pt bladder scanned; 89 cc in bladder. EDP made aware.

## 2016-11-03 NOTE — ED Notes (Signed)
ED Provider at bedside. 

## 2016-11-03 NOTE — ED Provider Notes (Signed)
[facility]EMERGENCY DEPT Provider Note   CSN: [id number] Arrival date & time: 11/03/16  0109     History   Chief Complaint Chief Complaint  Patient presents with  . Urinary Retention  . Abdominal Pain    HPI Ashley Lane is a 41 y.o. female.  Patient presents with 10 days of low back pain that has been progressively worsening now associated with left lower quadrant abdominal pain. She was seen by her gynecologist and treated for UTI on August 3 completed a seven-day course of Macrobid. She states she feels no better. Has also developed left-sided lower abdominal cramping. Has been constipated but had a BM today. Nausea but no vomiting. No fever. No pain with urination or hematuria. No vaginal bleeding or discharge. Feels that she is not emptying her bladder completely has had urgency and frequency. Denies any chest pain or shortness of breath. She has had a poor appetite.   The history is provided by the patient.  Abdominal Pain   Associated symptoms include nausea, constipation and dysuria. Pertinent negatives include fever, vomiting, hematuria, headaches, arthralgias and myalgias.    Past Medical History:  Diagnosis Date  . AMA (advanced maternal age) multigravida 35+   . Anemia    hx  . H/O varicella   . Hx of pyelonephritis     Patient Active Problem List   Diagnosis Date Noted  . Menorrhagia 12/16/2012    Past Surgical History:  Procedure Laterality Date  . NO PAST SURGERIES      OB History    Gravida Para Term Preterm AB Living   3 3 3  0 0 3   SAB TAB Ectopic Multiple Live Births   0 0 0 0 3       Home Medications    Prior to Admission medications   Medication Sig Start Date End Date Taking? Authorizing Provider  nitrofurantoin, macrocrystal-monohydrate, (MACROBID) 100 MG capsule Take 1 capsule (100 mg total) by mouth 2 (two) times daily. For 7 days Patient not taking: Reported on 11/03/2016 10/24/16   Fontaine, Nadyne Coombes, MD    Family History Family  History  Problem Relation Age of Onset  . Hypertension Mother   . Thyroid disease Mother   . Cancer Sister        cervical  . Hypertension Maternal Grandmother   . Thyroid disease Maternal Grandmother   . Hypertension Maternal Grandfather     Social History Social History  Substance Use Topics  . Smoking status: Never Smoker  . Smokeless tobacco: Never Used  . Alcohol use No     Allergies   Latex   Review of Systems Review of Systems  Constitutional: Positive for activity change and appetite change. Negative for fatigue and fever.  HENT: Negative for congestion and rhinorrhea.   Eyes: Negative for visual disturbance.  Respiratory: Negative for cough, chest tightness and shortness of breath.   Gastrointestinal: Positive for abdominal pain, constipation and nausea. Negative for vomiting.  Genitourinary: Positive for difficulty urinating, dysuria and pelvic pain. Negative for hematuria, vaginal bleeding and vaginal discharge.  Musculoskeletal: Positive for back pain. Negative for arthralgias and myalgias.  Neurological: Negative for dizziness, weakness and headaches.    all other systems are negative except as noted in the HPI and PMH.    Physical Exam Updated Vital Signs BP 132/81 (BP Location: Right Arm)   Pulse 83   Temp 98.2 F (36.8 C) (Oral)   Resp 16   LMP 10/26/2016 (Exact Date)   SpO2 98%  Physical Exam  Constitutional: She is oriented to person, place, and time. She appears well-developed and well-nourished. No distress.  HENT:  Head: Normocephalic and atraumatic.  Mouth/Throat: Oropharynx is clear and moist. No oropharyngeal exudate.  Eyes: Pupils are equal, round, and reactive to light. Conjunctivae and EOM are normal.  Neck: Normal range of motion. Neck supple.  No meningismus.  Cardiovascular: Normal rate, regular rhythm, normal heart sounds and intact distal pulses.   No murmur heard. Pulmonary/Chest: Effort normal and breath sounds normal. No  respiratory distress.  Abdominal: Soft. There is tenderness. There is no rebound and no guarding.  TTP suprapubic and LLQ with guarding  Musculoskeletal: Normal range of motion. She exhibits tenderness. She exhibits no edema.  Bilateral paraspinal lumbar tenderness  no midline tenderness  Neurological: She is alert and oriented to person, place, and time. No cranial nerve deficit. She exhibits normal muscle tone. Coordination normal.   5/5 strength throughout. CN 2-12 intact.Equal grip strength.   Skin: Skin is warm. Capillary refill takes less than 2 seconds.  Psychiatric: She has a normal mood and affect. Her behavior is normal.  Nursing note and vitals reviewed.    ED Treatments / Results  Labs (all labs ordered are listed, but only abnormal results are displayed) Labs Reviewed  COMPREHENSIVE METABOLIC PANEL - Abnormal; Notable for the following:       Result Value   Glucose, Bld 149 (*)    All other components within normal limits  CBC - Abnormal; Notable for the following:    Hemoglobin 11.6 (*)    HCT 35.0 (*)    All other components within normal limits  URINALYSIS, ROUTINE W REFLEX MICROSCOPIC - Abnormal; Notable for the following:    Color, Urine STRAW (*)    All other components within normal limits  WET PREP, GENITAL  LIPASE, BLOOD  POC URINE PREG, ED  GC/CHLAMYDIA PROBE AMP ([place]) NOT AT [facility]    EKG  EKG Interpretation None       Radiology Ct Abdomen Pelvis W Contrast  Result Date: 11/03/2016 CLINICAL DATA:  Subacute onset of generalized abdominal pain and lower back pain. Nausea. Initial encounter. EXAM: CT ABDOMEN AND PELVIS WITH CONTRAST TECHNIQUE: Multidetector CT imaging of the abdomen and pelvis was performed using the standard protocol following bolus administration of intravenous contrast. CONTRAST:  ISOVUE-300 IOPAMIDOL (ISOVUE-300) INJECTION 61% COMPARISON:  Pelvic ultrasound performed 02/03/2012 FINDINGS: Lower chest: The visualized  lung bases are grossly clear. The visualized portions of the mediastinum are unremarkable. Hepatobiliary: The liver is unremarkable in appearance. The gallbladder is unremarkable in appearance. The common bile duct remains normal in caliber. Pancreas: The pancreas is within normal limits. Spleen: The spleen is unremarkable in appearance. Adrenals/Urinary Tract: The adrenal glands are unremarkable in appearance. The kidneys are within normal limits. There is no evidence of hydronephrosis. No renal or ureteral stones are identified. No perinephric stranding is seen. Stomach/Bowel: The stomach is unremarkable in appearance. The small bowel is within normal limits. The appendix is normal in caliber, without evidence of appendicitis. The colon is unremarkable in appearance. Vascular/Lymphatic: Minimal calcification is noted at the distal abdominal aorta and its branches. No retroperitoneal or pelvic sidewall lymphadenopathy is seen. Reproductive: The bladder is relatively decompressed. Mild bladder wall thickening may reflect cystitis. The uterus is unremarkable in appearance. The ovaries are relatively symmetric. No suspicious adnexal masses are seen. Other: No additional soft tissue abnormalities are seen. Musculoskeletal: No acute osseous abnormalities are identified. The visualized musculature is  unremarkable in appearance. IMPRESSION: 1. Mild bladder wall thickening may reflect cystitis. 2. No additional acute abnormality within the abdomen or pelvis. Electronically Signed   By: Roanna RaiderJeffery  Chang M.D.   On: 11/03/2016 05:02    Procedures Procedures (including critical care time)  Medications Ordered in ED Medications  iopamidol (ISOVUE-300) 61 % injection (not administered)     Initial Impression / Assessment and Plan / ED Course  I have reviewed the triage vital signs and the nursing notes.  Pertinent labs & imaging results that were available during my care of the patient were reviewed by me and  considered in my medical decision making (see chart for details).     Patient with 10 days of low back pain now with left-sided lower abdominal pain. Had been treated for UTI by gynecologist though culture showed no growth. Urinalysis at that time showed only bacteria without LE or nitrite.  Bladder scan 89 mL. No signs of urinary retention.  Urinalysis is negative. Patient declines pelvic exam.  CT scan is negative. There is mild bladder wall thickening  Discussed results with patient. She continues to decline pelvic exam. States she will follow-up with her gynecologist. She understands that there is a possibility of ovarian pathology being missed. She has been diagnosed with an ovarian cyst in the past. Low suspicion for ovarian torsion as she has had ongoing pain for a week or more.  Follow up with her GYN. Repeat urine culture sent. Add miralax and pyridium. Return precautions discussed.  Final Clinical Impressions(s) / ED Diagnoses   Final diagnoses:  Abdominal pain, unspecified abdominal location    New Prescriptions New Prescriptions   No medications on file     Glynn Octaveancour, Izayah Miner, MD 11/03/16 [phone]

## 2016-11-04 LAB — URINE CULTURE: Culture: NO GROWTH

## 2017-08-05 ENCOUNTER — Telehealth: Payer: Self-pay

## 2017-08-05 NOTE — Telephone Encounter (Signed)
Patient was sent to the triage voice mail because no appointments available this week as Dr. Velvet Bathe only provider here next 2 days and schedules are full. I talked with patient to see if this was an emergency that warranted a work-in or advice from Dr. Velvet Bathe.  She is having a vaginal discharge that is yellowish in color with a slight odor. No pain, burning or itching. Just the discharge. Recommended office visit for first of week. She is fine with that and agrees this is not an emergency situation for her. Also she is a few mos shy of being 2 years overdue for her annual. She will schedule that as well. Debarah Crape will be calling her to schedule these appointments.

## 2017-08-11 ENCOUNTER — Encounter: Payer: Self-pay | Admitting: Women's Health

## 2017-08-11 ENCOUNTER — Ambulatory Visit: Payer: BLUE CROSS/BLUE SHIELD | Admitting: Women's Health

## 2017-08-11 VITALS — BP 118/76

## 2017-08-11 DIAGNOSIS — N898 Other specified noninflammatory disorders of vagina: Secondary | ICD-10-CM

## 2017-08-11 LAB — WET PREP FOR TRICH, YEAST, CLUE

## 2017-08-11 NOTE — Progress Notes (Signed)
42 year old MWF G3, P3 presents with complaint of questionable vaginal infection, having vaginal irritation, occasion itching.  Continues to have a regular monthly cycle/ rhythm.  Denies urinary symptoms, abdominal pain, fever.    Exam: Appears well, obese, no CVAT.  Abdomen soft without rebound or radiation of pain.  External genitalia within normal limits, minimal erythema, speculum exam scant white discharge with no odor or erythema noted, wet prep negative.  Bimanual no CMT or adnexal tenderness.  Normal exam  Plan: Reviewed normality of a exam and wet prep, reassurance given.  Contraception reviewed and husbnd planning vasectomy.  Schedule annual exam, overdue.

## 2017-09-23 ENCOUNTER — Encounter: Payer: Self-pay | Admitting: Neurology

## 2017-09-23 ENCOUNTER — Other Ambulatory Visit: Payer: Self-pay

## 2017-09-23 ENCOUNTER — Ambulatory Visit: Payer: BLUE CROSS/BLUE SHIELD | Admitting: Neurology

## 2017-09-23 VITALS — BP 120/75 | HR 84 | Resp 16 | Ht 66.0 in | Wt 245.0 lb

## 2017-09-23 DIAGNOSIS — G43709 Chronic migraine without aura, not intractable, without status migrainosus: Secondary | ICD-10-CM

## 2017-09-23 DIAGNOSIS — IMO0002 Reserved for concepts with insufficient information to code with codable children: Secondary | ICD-10-CM | POA: Insufficient documentation

## 2017-09-23 NOTE — Patient Instructions (Signed)
Magnesium oxide 400 mg twice a day Riboflavin 100 mg twice a day= vitamin B2 

## 2017-09-23 NOTE — Progress Notes (Signed)
PATIENT: Ashley Lane DOB: [birth year]  Chief Complaint  Patient presents with  . Dizziness    Rm. 4  PCP: Aleatha BorerBrooke Falstreau, NP [facility]. Intermittent dizziness onset several mos. ago, not related to position changes. Unable to identify triggers/fim     HISTORICAL  Ashley Lane is a 42 year old female, seen in refer by her primary care physician Dr. Cyndia BentBadger, Kayleen MemosMichael C, for evaluation of dizziness, initial evaluation was on September 23, 2017.  She reported a history of intermittent headache in the past, especially around her menstruation, her headache diary worsening by movement, with light noise sensitivity, referred to lie down in a dark quiet room,  Since February 2019, she noted intermittent episode of dizziness followed by headaches, she described lightheaded woozy sensation, as if she is going to pass out, there was no vertigo, if she sit down resting for 30 minutes, her symptoms usually improve, but oftentimes followed by mild to moderate bilateral frontal pressure headaches, sleeping helps, as needed NSAIDs was helpful too, she having spells about couple times each month,  She is also concerned with a skin bump at the right frontal region, I do not feel any significant abnormality,  Laboratory evaluation September 16, 2017 normal CBC, hemoglobin of 11.9,, CMP ALT 43, TSH 1.04   REVIEW OF SYSTEMS: Full 14 system review of systems performed and notable only for as above  ALLERGIES: Allergies  Allergen Reactions  . Latex Itching    HOME MEDICATIONS: No current outpatient medications on file.   No current facility-administered medications for this visit.     PAST MEDICAL HISTORY: Past Medical History:  Diagnosis Date  . AMA (advanced maternal age) multigravida 35+   . Anemia    hx  . H/O varicella   . Hx of pyelonephritis     PAST SURGICAL HISTORY: Past Surgical History:  Procedure Laterality Date  . NO PAST SURGERIES      FAMILY  HISTORY: Family History  Problem Relation Age of Onset  . Hypertension Mother   . Thyroid disease Mother   . Cancer Sister        cervical  . Hypertension Maternal Grandmother   . Thyroid disease Maternal Grandmother   . Hypertension Maternal Grandfather   . Other Father        died in car accident    SOCIAL HISTORY:  Social History   Socioeconomic History  . Marital status: Married    Spouse name: Not on file  . Number of children: Not on file  . Years of education: Not on file  . Highest education level: Not on file  Occupational History  . Not on file  Social Needs  . Financial resource strain: Not on file  . Food insecurity:    Worry: Not on file    Inability: Not on file  . Transportation needs:    Medical: Not on file    Non-medical: Not on file  Tobacco Use  . Smoking status: Never Smoker  . Smokeless tobacco: Never Used  Substance and Sexual Activity  . Alcohol use: No  . Drug use: No  . Sexual activity: Yes    Birth control/protection: None  Lifestyle  . Physical activity:    Days per week: Not on file    Minutes per session: Not on file  . Stress: Not on file  Relationships  . Social connections:    Talks on phone: Not on file    Gets together: Not on file  Attends religious service: Not on file    Active member of club or organization: Not on file    Attends meetings of clubs or organizations: Not on file    Relationship status: Not on file  . Intimate partner violence:    Fear of current or ex partner: Not on file    Emotionally abused: Not on file    Physically abused: Not on file    Forced sexual activity: Not on file  Other Topics Concern  . Not on file  Social History Narrative  . Not on file     PHYSICAL EXAM   Vitals:   09/23/17 0906  BP: 120/75  Pulse: 84  Resp: 16  Weight: 245 lb (111.1 kg)  Height: 5\' 6"  (1.676 m)    Not recorded      Body mass index is 39.54 kg/m.  PHYSICAL EXAMNIATION:  Gen: NAD,  conversant, well nourised, obese, well groomed                     Cardiovascular: Regular rate rhythm, no peripheral edema, warm, nontender. Eyes: Conjunctivae clear without exudates or hemorrhage Neck: Supple, no carotid bruits. Pulmonary: Clear to auscultation bilaterally   NEUROLOGICAL EXAM:  MENTAL STATUS: Speech:    Speech is normal; fluent and spontaneous with normal comprehension.  Cognition:     Orientation to time, place and person     Normal recent and remote memory     Normal Attention span and concentration     Normal Language, naming, repeating,spontaneous speech     Fund of knowledge   CRANIAL NERVES: CN II: Visual fields are full to confrontation. Fundoscopic exam is normal with sharp discs and no vascular changes. Pupils are round equal and briskly reactive to light. CN III, IV, VI: extraocular movement are normal. No ptosis. CN V: Facial sensation is intact to pinprick in all 3 divisions bilaterally. Corneal responses are intact.  CN VII: Face is symmetric with normal eye closure and smile. CN VIII: Hearing is normal to rubbing fingers CN IX, X: Palate elevates symmetrically. Phonation is normal. CN XI: Head turning and shoulder shrug are intact CN XII: Tongue is midline with normal movements and no atrophy.  MOTOR: There is no pronator drift of out-stretched arms. Muscle bulk and tone are normal. Muscle strength is normal.  REFLEXES: Reflexes are 2+ and symmetric at the biceps, triceps, knees, and ankles. Plantar responses are flexor.  SENSORY: Intact to light touch, pinprick, positional sensation and vibratory sensation are intact in fingers and toes.  COORDINATION: Rapid alternating movements and fine finger movements are intact. There is no dysmetria on finger-to-nose and heel-knee-shin.    GAIT/STANCE: Posture is normal. Gait is steady with normal steps, base, arm swing, and turning. Heel and toe walking are normal. Tandem gait is normal.  Romberg is  absent.   DIAGNOSTIC DATA (LABS, IMAGING, TESTING) - I reviewed patient records, labs, notes, testing and imaging myself where available.   ASSESSMENT AND PLAN  [name] is a 42 y.o. female    Dizziness was associated with headaches,  Has some migraine features   normal neurological examinations,  I have suggested magnesium oxide 400 mg twice a day, riboflavin 100 mg twice a day  Aleve as needed  Call clinic for worsening headaches    Levert Feinstein, M.D. Ph.D.  [facility] [address], [address] [place], [place] [address] Ph: [phone] Fax: [phone]  CC:  Eartha Inch, MD

## 2017-09-28 ENCOUNTER — Telehealth: Payer: Self-pay | Admitting: Neurology

## 2017-09-28 DIAGNOSIS — R51 Headache: Principal | ICD-10-CM

## 2017-09-28 DIAGNOSIS — R519 Headache, unspecified: Secondary | ICD-10-CM

## 2017-09-28 NOTE — Telephone Encounter (Signed)
Pt said at [facility] Dr Terrace ArabiaYan discussed her having an MRI but the patient wanted to wait until she saw her PCP. Pt's PCP said she thought it would be better if Dr Terrace ArabiaYan ordered it. Please call to advise

## 2017-09-28 NOTE — Telephone Encounter (Signed)
Per vo by Dr. Terrace ArabiaYan, place order in Epic for MRI brain w/o contrast to further evaluate her headaches and dizziness.

## 2017-09-29 ENCOUNTER — Telehealth: Payer: Self-pay | Admitting: Neurology

## 2017-09-29 NOTE — Telephone Encounter (Signed)
MR Brain wo contrast Dr. Mertie ClauseYan [employer] Auth: [id number] (exp. 09/29/17 to 10/28/17)  [facility].

## 2017-09-30 ENCOUNTER — Ambulatory Visit: Payer: BLUE CROSS/BLUE SHIELD

## 2017-09-30 DIAGNOSIS — R519 Headache, unspecified: Secondary | ICD-10-CM

## 2017-09-30 DIAGNOSIS — R51 Headache: Secondary | ICD-10-CM

## 2017-10-07 ENCOUNTER — Ambulatory Visit: Payer: BLUE CROSS/BLUE SHIELD | Admitting: Women's Health

## 2017-10-07 ENCOUNTER — Encounter: Payer: Self-pay | Admitting: Women's Health

## 2017-10-07 VITALS — BP 114/72 | Ht 65.5 in | Wt 246.6 lb

## 2017-10-07 DIAGNOSIS — Z01419 Encounter for gynecological examination (general) (routine) without abnormal findings: Secondary | ICD-10-CM | POA: Diagnosis not present

## 2017-10-07 DIAGNOSIS — Z8632 Personal history of gestational diabetes: Secondary | ICD-10-CM

## 2017-10-07 NOTE — Progress Notes (Signed)
Ashley PalmChristina Lane 05-08-1975 [id number]    History:    Presents for annual exam.  Monthly cycle/rhythm.  Husband scheduled for vasectomy tomorrow.  normal Pap and mammogram history.  History of GDM.  Normal brain MRI 10/02/2017 for migraines.  Increased stress incontinence with cough or sneeze even after emptying bladder.  Past medical history, past surgical history, family history and social history were all reviewed and documented in the EPIC chart.  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  ROS:  A ROS was performed and pertinent positives and negatives are included.  Exam:  Vitals:   10/07/17 1402  Weight: 246 lb 9.6 oz (111.9 kg)  Height: 5' 5.5" (1.664 m)   Body mass index is 40.41 kg/m.   General appearance:  Normal Thyroid:  Symmetrical, normal in size, without palpable masses or nodularity. Respiratory  Auscultation:  Clear without wheezing or rhonchi Cardiovascular  Auscultation:  Regular rate, without rubs, murmurs or gallops  Edema/varicosities:  Not grossly evident Abdominal  Soft,nontender, without masses, guarding or rebound.  Liver/spleen:  No organomegaly noted  Hernia:  None appreciated  Skin  Inspection:  Grossly normal   Breasts: Examined lying and sitting.     Right: Without masses, retractions, discharge or axillary adenopathy.     Left: Without masses, retractions, discharge or axillary adenopathy. Gentitourinary   Inguinal/mons:  Normal without inguinal adenopathy  External genitalia:  Normal  BUS/Urethra/Skene's glands:  Normal  Vagina:  Normal  Cervix:  Normal  Uterus:   normal in size, shape and contour.  Midline and mobile  Adnexa/parametria:     Rt: Without masses or tenderness.   Lt: Without masses or tenderness.  Anus and perineum: Normal  Digital rectal exam: Normal sphincter tone without palpated masses or tenderness  Assessment/Plan:  42 y.o. WF G3, P3 for annual exam.      Monthly cycle/rhythm/vasectomy scheduled Migraines - negative MRI 10/02/2017 Obesity History of elevated glucose, labs primary care  Plan: SBE's, annual screening mammogram, calcium rich foods, vitamin D 1000 daily encouraged.  Aware vasectomy not immediately effective but will have negative SA after procedure.  Stress incontinence reviewed we will get referral to urologist to evaluate.  Reviewed importance of weight loss especially abdominal which will also help.  Reviewed importance of increasing exercise and decreasing calorie/carbs.  Hemoglobin A1c.  Pap with HR HPV typing.  New screening guidelines reviewed.  Harrington Challengerancy J Young [facility] LLCWHNP, 2:04 PM 10/07/2017

## 2017-10-07 NOTE — Addendum Note (Signed)
Addended by: Becky SaxPARKER, Diamonique Ruedas M on: 10/07/2017 03:38 PM   Modules accepted: Orders

## 2017-10-07 NOTE — Patient Instructions (Addendum)
Health Maintenance, Female Adopting a healthy lifestyle and getting preventive care can go a long way to promote health and wellness. Talk with your health care provider about what schedule of regular examinations is right for you. This is a good chance for you to check in with your provider about disease prevention and staying healthy. In between checkups, there are plenty of things you can do on your own. Experts have done a lot of research about which lifestyle changes and preventive measures are most likely to keep you healthy. Ask your health care provider for more information. Weight and diet Eat a healthy diet  Be sure to include plenty of vegetables, fruits, low-fat dairy products, and lean protein.  Do not eat a lot of foods high in solid fats, added sugars, or salt.  Get regular exercise. This is one of the most important things you can do for your health. ? Most adults should exercise for at least 150 minutes each week. The exercise should increase your heart rate and make you sweat (moderate-intensity exercise). ? Most adults should also do strengthening exercises at least twice a week. This is in addition to the moderate-intensity exercise.  Maintain a healthy weight  Body mass index (BMI) is a measurement that can be used to identify possible weight problems. It estimates body fat based on height and weight. Your health care provider can help determine your BMI and help you achieve or maintain a healthy weight.  For females 20 years of age and older: ? A BMI below 18.5 is considered underweight. ? A BMI of 18.5 to 24.9 is normal. ? A BMI of 25 to 29.9 is considered overweight. ? A BMI of 30 and above is considered obese.  Watch levels of cholesterol and blood lipids  You should start having your blood tested for lipids and cholesterol at 41 years of age, then have this test every 5 years.  You may need to have your cholesterol levels checked more often if: ? Your lipid or  cholesterol levels are high. ? You are older than 41 years of age. ? You are at high risk for heart disease.  Cancer screening Lung Cancer  Lung cancer screening is recommended for adults 55-80 years old who are at high risk for lung cancer because of a history of smoking.  A yearly low-dose CT scan of the lungs is recommended for people who: ? Currently smoke. ? Have quit within the past 15 years. ? Have at least a 30-pack-year history of smoking. A pack year is smoking an average of one pack of cigarettes a day for 1 year.  Yearly screening should continue until it has been 15 years since you quit.  Yearly screening should stop if you develop a health problem that would prevent you from having lung cancer treatment.  Breast Cancer  Practice breast self-awareness. This means understanding how your breasts normally appear and feel.  It also means doing regular breast self-exams. Let your health care provider know about any changes, no matter how small.  If you are in your 20s or 30s, you should have a clinical breast exam (CBE) by a health care provider every 1-3 years as part of a regular health exam.  If you are 40 or older, have a CBE every year. Also consider having a breast X-ray (mammogram) every year.  If you have a family history of breast cancer, talk to your health care provider about genetic screening.  If you are at high risk   for breast cancer, talk to your health care provider about having an MRI and a mammogram every year.  Breast cancer gene (BRCA) assessment is recommended for women who have family members with BRCA-related cancers. BRCA-related cancers include: ? Breast. ? Ovarian. ? Tubal. ? Peritoneal cancers.  Results of the assessment will determine the need for genetic counseling and BRCA1 and BRCA2 testing.  Cervical Cancer Your health care provider may recommend that you be screened regularly for cancer of the pelvic organs (ovaries, uterus, and  vagina). This screening involves a pelvic examination, including checking for microscopic changes to the surface of your cervix (Pap test). You may be encouraged to have this screening done every 3 years, beginning at age 22.  For women ages 56-65, health care providers may recommend pelvic exams and Pap testing every 3 years, or they may recommend the Pap and pelvic exam, combined with testing for human papilloma virus (HPV), every 5 years. Some types of HPV increase your risk of cervical cancer. Testing for HPV may also be done on women of any age with unclear Pap test results.  Other health care providers may not recommend any screening for nonpregnant women who are considered low risk for pelvic cancer and who do not have symptoms. Ask your health care provider if a screening pelvic exam is right for you.  If you have had past treatment for cervical cancer or a condition that could lead to cancer, you need Pap tests and screening for cancer for at least 20 years after your treatment. If Pap tests have been discontinued, your risk factors (such as having a new sexual partner) need to be reassessed to determine if screening should resume. Some women have medical problems that increase the chance of getting cervical cancer. In these cases, your health care provider may recommend more frequent screening and Pap tests.  Colorectal Cancer  This type of cancer can be detected and often prevented.  Routine colorectal cancer screening usually begins at 41 years of age and continues through 41 years of age.  Your health care provider may recommend screening at an earlier age if you have risk factors for colon cancer.  Your health care provider may also recommend using home test kits to check for hidden blood in the stool.  A small camera at the end of a tube can be used to examine your colon directly (sigmoidoscopy or colonoscopy). This is done to check for the earliest forms of colorectal  cancer.  Routine screening usually begins at age 33.  Direct examination of the colon should be repeated every 5-10 years through 41 years of age. However, you may need to be screened more often if early forms of precancerous polyps or small growths are found.  Skin Cancer  Check your skin from head to toe regularly.  Tell your health care provider about any new moles or changes in moles, especially if there is a change in a mole's shape or color.  Also tell your health care provider if you have a mole that is larger than the size of a pencil eraser.  Always use sunscreen. Apply sunscreen liberally and repeatedly throughout the day.  Protect yourself by wearing long sleeves, pants, a wide-brimmed hat, and sunglasses whenever you are outside.  Heart disease, diabetes, and high blood pressure  High blood pressure causes heart disease and increases the risk of stroke. High blood pressure is more likely to develop in: ? People who have blood pressure in the high end of  the normal range (130-139/85-89 mm Hg). ? People who are overweight or obese. ? People who are African American.  If you are 21-29 years of age, have your blood pressure checked every 3-5 years. If you are 3 years of age or older, have your blood pressure checked every year. You should have your blood pressure measured twice-once when you are at a hospital or clinic, and once when you are not at a hospital or clinic. Record the average of the two measurements. To check your blood pressure when you are not at a hospital or clinic, you can use: ? An automated blood pressure machine at a pharmacy. ? A home blood pressure monitor.  If you are between 17 years and 37 years old, ask your health care provider if you should take aspirin to prevent strokes.  Have regular diabetes screenings. This involves taking a blood sample to check your fasting blood sugar level. ? If you are at a normal weight and have a low risk for diabetes,  have this test once every three years after 41 years of age. ? If you are overweight and have a high risk for diabetes, consider being tested at a younger age or more often. Preventing infection Hepatitis B  If you have a higher risk for hepatitis B, you should be screened for this virus. You are considered at high risk for hepatitis B if: ? You were born in a country where hepatitis B is common. Ask your health care provider which countries are considered high risk. ? Your parents were born in a high-risk country, and you have not been immunized against hepatitis B (hepatitis B vaccine). ? You have HIV or AIDS. ? You use needles to inject street drugs. ? You live with someone who has hepatitis B. ? You have had sex with someone who has hepatitis B. ? You get hemodialysis treatment. ? You take certain medicines for conditions, including cancer, organ transplantation, and autoimmune conditions.  Hepatitis C  Blood testing is recommended for: ? Everyone born from [birth year] through 1964. ? Anyone with known risk factors for hepatitis C.  Sexually transmitted infections (STIs)  You should be screened for sexually transmitted infections (STIs) including gonorrhea and chlamydia if: ? You are sexually active and are younger than 41 years of age. ? You are older than 41 years of age and your health care provider tells you that you are at risk for this type of infection. ? Your sexual activity has changed since you were last screened and you are at an increased risk for chlamydia or gonorrhea. Ask your health care provider if you are at risk.  If you do not have HIV, but are at risk, it may be recommended that you take a prescription medicine daily to prevent HIV infection. This is called pre-exposure prophylaxis (PrEP). You are considered at risk if: ? You are sexually active and do not regularly use condoms or know the HIV status of your partner(s). ? You take drugs by injection. ? You are  sexually active with a partner who has HIV.  Talk with your health care provider about whether you are at high risk of being infected with HIV. If you choose to begin PrEP, you should first be tested for HIV. You should then be tested every 3 months for as long as you are taking PrEP. Pregnancy  If you are premenopausal and you may become pregnant, ask your health care provider about preconception counseling.  If you may become  pregnant, take 400 to 800 micrograms (mcg) of folic acid every day.  If you want to prevent pregnancy, talk to your health care provider about birth control (contraception). Osteoporosis and menopause  Osteoporosis is a disease in which the bones lose minerals and strength with aging. This can result in serious bone fractures. Your risk for osteoporosis can be identified using a bone density scan.  If you are 65 years of age or older, or if you are at risk for osteoporosis and fractures, ask your health care provider if you should be screened.  Ask your health care provider whether you should take a calcium or vitamin D supplement to lower your risk for osteoporosis.  Menopause may have certain physical symptoms and risks.  Hormone replacement therapy may reduce some of these symptoms and risks. Talk to your health care provider about whether hormone replacement therapy is right for you. Follow these instructions at home:  Schedule regular health, dental, and eye exams.  Stay current with your immunizations.  Do not use any tobacco products including cigarettes, chewing tobacco, or electronic cigarettes.  If you are pregnant, do not drink alcohol.  If you are breastfeeding, limit how much and how often you drink alcohol.  Limit alcohol intake to no more than 1 drink per day for nonpregnant women. One drink equals 12 ounces of beer, 5 ounces of wine, or 1 ounces of hard liquor.  Do not use street drugs.  Do not share needles.  Ask your health care  provider for help if you need support or information about quitting drugs.  Tell your health care provider if you often feel depressed.  Tell your health care provider if you have ever been abused or do not feel safe at home. This information is not intended to replace advice given to you by your health care provider. Make sure you discuss any questions you have with your health care provider. Document Released: 09/23/2010 Document Revised: 08/16/2015 Document Reviewed: 12/12/2014 Elsevier Interactive Patient Education  2018 [employer] Carbohydrate Counting for Diabetes Mellitus, Adult Carbohydrate counting is a method for keeping track of how many carbohydrates you eat. Eating carbohydrates naturally increases the amount of sugar (glucose) in the blood. Counting how many carbohydrates you eat helps keep your blood glucose within normal limits, which helps you manage your diabetes (diabetes mellitus). It is important to know how many carbohydrates you can safely have in each meal. This is different for every person. A diet and nutrition specialist (registered dietitian) can help you make a meal plan and calculate how many carbohydrates you should have at each meal and snack. Carbohydrates are found in the following foods:  Grains, such as breads and cereals.  Dried beans and soy products.  Starchy vegetables, such as potatoes, peas, and corn.  Fruit and fruit juices.  Milk and yogurt.  Sweets and snack foods, such as cake, cookies, candy, chips, and soft drinks.  How do I count carbohydrates? There are two ways to count carbohydrates in food. You can use either of the methods or a combination of both. Reading "Nutrition Facts" on packaged food The "Nutrition Facts" list is included on the labels of almost all packaged foods and beverages in the [place] It includes:  The serving size.  Information about nutrients in each serving, including the grams (g) of carbohydrate per  serving.  To use the "Nutrition Facts":  Decide how many servings you will have.  Multiply the number of servings by the number of carbohydrates   per serving.  The resulting number is the total amount of carbohydrates that you will be having.  Learning standard serving sizes of other foods When you eat foods containing carbohydrates that are not packaged or do not include "Nutrition Facts" on the label, you need to measure the servings in order to count the amount of carbohydrates:  Measure the foods that you will eat with a food scale or measuring cup, if needed.  Decide how many standard-size servings you will eat.  Multiply the number of servings by 15. Most carbohydrate-rich foods have about 15 g of carbohydrates per serving. ? For example, if you eat 8 oz (170 g) of strawberries, you will have eaten 2 servings and 30 g of carbohydrates (2 servings x 15 g = 30 g).  For foods that have more than one food mixed, such as soups and casseroles, you must count the carbohydrates in each food that is included.  The following list contains standard serving sizes of common carbohydrate-rich foods. Each of these servings has about 15 g of carbohydrates:   hamburger bun or  English muffin.   oz (15 mL) syrup.   oz (14 g) jelly.  1 slice of bread.  1 six-inch tortilla.  3 oz (85 g) cooked rice or pasta.  4 oz (113 g) cooked dried beans.  4 oz (113 g) starchy vegetable, such as peas, corn, or potatoes.  4 oz (113 g) hot cereal.  4 oz (113 g) mashed potatoes or  of a large baked potato.  4 oz (113 g) canned or frozen fruit.  4 oz (120 mL) fruit juice.  4-6 crackers.  6 chicken nuggets.  6 oz (170 g) unsweetened dry cereal.  6 oz (170 g) plain fat-free yogurt or yogurt sweetened with artificial sweeteners.  8 oz (240 mL) milk.  8 oz (170 g) fresh fruit or one small piece of fruit.  24 oz (680 g) popped popcorn.  Example of carbohydrate counting Sample meal  3  oz (85 g) chicken breast.  6 oz (170 g) brown rice.  4 oz (113 g) corn.  8 oz (240 mL) milk.  8 oz (170 g) strawberries with sugar-free whipped topping. Carbohydrate calculation 1. Identify the foods that contain carbohydrates: ? Rice. ? Corn. ? Milk. ? Strawberries. 2. Calculate how many servings you have of each food: ? 2 servings rice. ? 1 serving corn. ? 1 serving milk. ? 1 serving strawberries. 3. Multiply each number of servings by 15 g: ? 2 servings rice x 15 g = 30 g. ? 1 serving corn x 15 g = 15 g. ? 1 serving milk x 15 g = 15 g. ? 1 serving strawberries x 15 g = 15 g. 4. Add together all of the amounts to find the total grams of carbohydrates eaten: ? 30 g + 15 g + 15 g + 15 g = 75 g of carbohydrates total. This information is not intended to replace advice given to you by your health care provider. Make sure you discuss any questions you have with your health care provider. Document Released: 03/10/2005 Document Revised: 09/28/2015 Document Reviewed: 08/22/2015 Elsevier Interactive Patient Education  2018 [place] Procedure for Stress Incontinence, Care After This sheet gives you information about how to care for yourself after your procedure. Your health care provider may also give you more specific instructions. If you have problems or questions, contact your health care provider. What can I expect after the procedure? After  the procedure, it is common to have:  Some pain in the abdomen.  Soreness at the incision site.  Difficulty urinating. You may have to use a catheter until you can urinate on your own.  Follow these instructions at home: Activity  Return to your normal activities as told by your health care provider. Ask your health care provider what activities are safe for you.  Do not exercise or lift anything that is heavier than 10 lb (4.5 kg) until your health care provider says that it is safe.  Do not have sexual intercourse until  your health care provider says it is okay. Incision care  Follow instructions from your health care provider about how to take care of your incision. Make sure you: ? Wash your hands with soap and water before you change your bandage (dressing). If soap and water are not available, use hand sanitizer. ? Change your dressing as told by your health care provider. ? Leave stitches (sutures), skin glue, or adhesive strips in place. These skin closures may need to stay in place for 2 weeks or longer. If adhesive strip edges start to loosen and curl up, you may trim the loose edges. Do not remove adhesive strips completely unless your health care provider tells you to do that.  Check your incision area every day for signs of infection. Check for: ? More redness, swelling, or pain. ? More fluid or blood. ? Warmth. ? Pus or a bad smell. Catheter care  If you have a catheter in place, follow instructions from your health care provider for care. Instructions may include the following: ? If the catheter is connected to a bedside bag, make sure that the bag is always below the level of your bladder. Do not lay it on the floor. If you cannot connect the bag to the side of your bed, a small stool or chair can be placed near the bed for you to hang the bag on. ? If the catheter is connected to a leg bag or belly bag, make sure that you do not lie down with the bag attached to your leg or belly. If you do, this may allow urine to flow back into your bladder from the bag.  If you were given intermittent catheters, use them as told by your health care provider. General instructions  Do not take baths, swim, or use a hot tub until your health care provider approves. You may take showers.  Take over-the-counter and prescription medicines only as told by your health care provider.  Do not drive until your health care provider approves.  Do not drink alcohol until your health care provider says it is  okay.  Resume your usual diet as told by your health care provider.  Try to maintain a healthy weight.  Be careful about drinking too much fluid. Talk with your health care provider about how much you can drink.  Plan to urinate regularly.  Keep all follow-up visits as told by your health care provider. This is important. Contact a health care provider if:  You have abnormal discharge from your vagina.  You have more redness, swelling, or pain around your incision.  You have more fluid or blood coming from your incision.  Your incision feels warm to the touch.  You have pus or a bad smell coming from your incision.  You have a fever or chills.  You have trouble with your catheter or the intermittent catheterization.  Your incision breaks open (edges  not staying together) after stitches have been removed.  You have increasing pain in the shoulders.  You have persistent nausea or vomiting.  You have pain when urinating or you have small amounts of blood in your urine.  You have consistent pain or pressure in your pelvis or abdomen that will not go away. Get help right away if:  You develop a rash.  You have difficulty breathing or have shortness of breath.  You feel dizzy when standing or you faint.  You cannot urinate.  You have chest pain. This information is not intended to replace advice given to you by your health care provider. Make sure you discuss any questions you have with your health care provider. Document Released: 02/08/2004 Document Revised: 12/04/2015 Document Reviewed: 12/04/2015 Elsevier Interactive Patient Education  [employer].

## 2017-10-08 ENCOUNTER — Telehealth: Payer: Self-pay | Admitting: *Deleted

## 2017-10-08 LAB — PAP, TP IMAGING W/ HPV RNA, RFLX HPV TYPE 16,18/45: HPV DNA High Risk: NOT DETECTED

## 2017-10-08 LAB — HEMOGLOBIN A1C
EAG (MMOL/L): 8.5 (calc)
HEMOGLOBIN A1C: 7 %{Hb} — AB (ref <5.7)
MEAN PLASMA GLUCOSE: 154 (calc)

## 2017-10-08 NOTE — Telephone Encounter (Signed)
-----   Message from Harrington ChallengerNancy J Young, NP sent at 10/07/2017  2:32 PM EDT ----- Urology consults   dr Perley JainMcdiarmid for stress incontinence

## 2017-10-08 NOTE — Telephone Encounter (Signed)
Referral notes faxed to [facility] they will call to schedule.

## 2017-10-09 ENCOUNTER — Encounter: Payer: Self-pay | Admitting: *Deleted

## 2017-10-14 ENCOUNTER — Ambulatory Visit: Payer: BLUE CROSS/BLUE SHIELD | Admitting: Women's Health

## 2017-10-14 ENCOUNTER — Encounter: Payer: Self-pay | Admitting: Women's Health

## 2017-10-14 VITALS — BP 126/80

## 2017-10-14 DIAGNOSIS — N898 Other specified noninflammatory disorders of vagina: Secondary | ICD-10-CM | POA: Diagnosis not present

## 2017-10-14 DIAGNOSIS — R300 Dysuria: Secondary | ICD-10-CM

## 2017-10-14 LAB — WET PREP FOR TRICH, YEAST, CLUE

## 2017-10-14 MED ORDER — FLUCONAZOLE 150 MG PO TABS: 150.0000 mg | ORAL_TABLET | Freq: Once | ORAL | 0 refills | Status: AC

## 2017-10-14 NOTE — Progress Notes (Signed)
42 year old MWF G3, P3 presents with complaint of urinary/vaginal burning for the past 3 days.  States was seen at an urgent care 5 days ago for UTI was placed on Septra had no relief and then changed to Cipro 500 for 5 days and still is having urinary/ vaginal burning.  Denies vaginal discharge, itching, back pain, abdominal pain or fever.  Monthly cycle, husband recently had vasectomy.  Exam: No CVAT.  Abdomen obese, nontender rebound or radiation of pain.  External genitalia mild erythema and introitus, speculum exam no visible discharge, erythema, wet prep negative. UA: Trace glucose, trace protein, nitrite positive, color orange, no WBCs, no RBCs, 6-10 squamous epithelials, few bacteria  Resolving UTI  Plan: Encouraged to complete Cipro antibiotic, increase plain water, UTI prevention discussed.  Reviewed normality of exam and UA.  Prescription for Diflucan 1501 dose given to take for prevention of yeast.  Reassurance given.  Instructed to call if continued problems.  Discussed hemoglobin A1c elevation, is actively working to increase regular exercise 30 minutes most days of the week, low carbohydrate simple sugar diet and will recheck hemoglobin A1c in 3 months.  Aware vasectomy not immediately effective, husband does have follow-up scheduled for SA.

## 2017-10-15 ENCOUNTER — Telehealth: Payer: Self-pay | Admitting: *Deleted

## 2017-10-15 NOTE — Telephone Encounter (Signed)
patient was prescribed Cipro 500 mg twice daily by urgent care,(in epic)  has taken 2 days worth and doesn't feel much improvement, taking azo daily, has a dull achy back feeling, no fever. She has increased fluid intake, asked if you have any other recommendations? Asked if the UTI has gotten bad enough could it moved to kidney infection. I did explain to her if her pain was severe to report to ER. Patient verbalized she understood. She just wanted to know if you had any other things to recommend? Please advise

## 2017-10-15 NOTE — Telephone Encounter (Signed)
I looked up the culture results from the urgent care and the bacteria was sensitive to ciprofloxacin.  500 mg twice daily is a big dose so it should take effect.  I would make sure she takes 5 to 7 days.  If they did not prescribe a 5 to 7-day course then I would cover her for that.

## 2017-10-16 NOTE — Telephone Encounter (Signed)
Patient scheduled on 10/23/17 @ 8:30am with Dr. Mc.Diarmid

## 2017-10-16 NOTE — Telephone Encounter (Signed)
Left detailed message on cell per DPR access. 

## 2017-10-16 NOTE — Telephone Encounter (Signed)
TC states feeling a little better, no nausea or fever.  Will finish out cipro, continue to push fluids and rest. Motrin as needed for back pain.  Instructed to F/U if fever, nausea or continued symptoms

## 2017-10-16 NOTE — Telephone Encounter (Signed)
Left message for pt to call.

## 2017-10-17 ENCOUNTER — Encounter (INDEPENDENT_AMBULATORY_CARE_PROVIDER_SITE_OTHER): Payer: Self-pay

## 2017-10-17 LAB — URINALYSIS, COMPLETE W/RFL CULTURE
Bilirubin Urine: NEGATIVE
HGB URINE DIPSTICK: NEGATIVE
HYALINE CAST: NONE SEEN /LPF
Ketones, ur: NEGATIVE
Leukocyte Esterase: NEGATIVE
NITRITES URINE, INITIAL: POSITIVE — AB
RBC / HPF: NONE SEEN /HPF (ref 0–2)
Specific Gravity, Urine: 1.007 (ref 1.001–1.03)
WBC, UA: NONE SEEN /HPF (ref 0–5)
pH: 5 (ref 5.0–8.0)

## 2017-10-17 LAB — URINE CULTURE
MICRO NUMBER:: 90880948
RESULT: NO GROWTH
SAMPLE SOURCE: 0
SPECIMEN QUALITY: ADEQUATE

## 2017-10-17 LAB — CULTURE INDICATED

## 2017-10-19 ENCOUNTER — Telehealth: Payer: Self-pay | Admitting: *Deleted

## 2017-10-19 NOTE — Telephone Encounter (Signed)
Patient called stating she is not feeling better completed Cipro 500 mg, no fever, no chills, slight back pain, pelvic pressure and slight burning with urination. Patient was scheduled for 10/23/17 visit but not able to make it because she is going out of town. I called [facility] and appointment desk transferred me to triage nurse which I had to leave a message to see if they can see her sooner. I did tell patient I can't promise they will be able to see her before Friday.

## 2017-10-19 NOTE — Telephone Encounter (Signed)
message left

## 2017-10-19 NOTE — Telephone Encounter (Signed)
Harriett Sineancy I wanted you to be aware of the below as well.

## 2017-10-30 NOTE — Telephone Encounter (Signed)
TC feeling better, has lost some weight, scheduled PT for bladder.

## 2018-03-22 ENCOUNTER — Other Ambulatory Visit: Payer: Self-pay | Admitting: Women's Health

## 2018-03-22 ENCOUNTER — Ambulatory Visit: Payer: BLUE CROSS/BLUE SHIELD | Admitting: Women's Health

## 2018-03-22 ENCOUNTER — Encounter: Payer: Self-pay | Admitting: Women's Health

## 2018-03-22 VITALS — BP 118/80

## 2018-03-22 DIAGNOSIS — Z1231 Encounter for screening mammogram for malignant neoplasm of breast: Secondary | ICD-10-CM

## 2018-03-22 DIAGNOSIS — N644 Mastodynia: Secondary | ICD-10-CM

## 2018-03-22 NOTE — Patient Instructions (Signed)

## 2018-03-22 NOTE — Progress Notes (Signed)
42 year old MWF G3, P3 presents with 1 week duration of bilateral breast tenderness diffuse in nature, greater on outer quadrant on right breast.  States she usually has breast tenderness week prior to cycle but this was after cycle.  Denies injury, change in routine, exercise, injury or nipple discharge.  Regular normal cycle 2 weeks ago.  Using rhythm and husband had vasectomy greater than 4 months ago but has not had a negative SA.  Last normal mammogram 03/2016.  No family history of breast cancer.  Medical problems include migraines.  Denies any other concerns today, urinary symptoms, vaginal symptoms, abdominal pain or fever.  Exam: Appears worried, well, obese.  Breast examined sitting and lying position pendulous without visible dimpling, retractions, erythema, nipple discharge or palpable nodules. Breast tenderness present throughout both breasts, greater on right breast. UPT negative  Bilateral breast tenderness  Plan: Schedule overdue mammogram, 3D recommended, will schedule.  Reviewed most likely hormonal, reviewed importance of annual screen.  Vitamin E twice daily, good supportive bra.

## 2018-03-25 LAB — PREGNANCY, URINE: PREG TEST UR: NEGATIVE

## 2018-04-21 ENCOUNTER — Ambulatory Visit
Admission: RE | Admit: 2018-04-21 | Discharge: 2018-04-21 | Disposition: A | Discharge status: Home / Self Care | Payer: BLUE CROSS/BLUE SHIELD | Source: Ambulatory Visit | Attending: Women's Health | Admitting: Women's Health

## 2018-04-21 DIAGNOSIS — Z1231 Encounter for screening mammogram for malignant neoplasm of breast: Secondary | ICD-10-CM

## 2018-05-04 ENCOUNTER — Other Ambulatory Visit (HOSPITAL_COMMUNITY): Payer: Self-pay | Admitting: Physician Assistant

## 2018-05-04 ENCOUNTER — Other Ambulatory Visit: Payer: Self-pay | Admitting: Physician Assistant

## 2018-05-04 DIAGNOSIS — R1011 Right upper quadrant pain: Secondary | ICD-10-CM

## 2018-05-05 ENCOUNTER — Encounter (HOSPITAL_COMMUNITY): Payer: Self-pay | Admitting: Emergency Medicine

## 2018-05-05 ENCOUNTER — Emergency Department (HOSPITAL_COMMUNITY)
Admission: EM | Admit: 2018-05-05 | Discharge: 2018-05-06 | Disposition: A | Discharge status: Home / Self Care | Payer: BLUE CROSS/BLUE SHIELD | Attending: Emergency Medicine | Admitting: Emergency Medicine

## 2018-05-05 ENCOUNTER — Other Ambulatory Visit: Payer: Self-pay

## 2018-05-05 DIAGNOSIS — Z91040 Latex allergy status: Secondary | ICD-10-CM | POA: Insufficient documentation

## 2018-05-05 DIAGNOSIS — R1011 Right upper quadrant pain: Secondary | ICD-10-CM | POA: Diagnosis not present

## 2018-05-05 DIAGNOSIS — R102 Pelvic and perineal pain: Secondary | ICD-10-CM | POA: Diagnosis not present

## 2018-05-05 LAB — CBC
HCT: 36.7 % (ref 36.0–46.0)
Hemoglobin: 12 g/dL (ref 12.0–15.0)
MCH: 27.7 pg (ref 26.0–34.0)
MCHC: 32.7 g/dL (ref 30.0–36.0)
MCV: 84.8 fL (ref 80.0–100.0)
Platelets: 388 10*3/uL (ref 150–400)
RBC: 4.33 MIL/uL (ref 3.87–5.11)
RDW: 12.9 % (ref 11.5–15.5)
WBC: 11.2 10*3/uL — ABNORMAL HIGH (ref 4.0–10.5)
nRBC: 0 % (ref 0.0–0.2)

## 2018-05-05 LAB — URINALYSIS, ROUTINE W REFLEX MICROSCOPIC
Bilirubin Urine: NEGATIVE
Glucose, UA: NEGATIVE mg/dL
Ketones, ur: 20 mg/dL — AB
Nitrite: NEGATIVE
Protein, ur: 100 mg/dL — AB
RBC / HPF: >50 RBC/hpf — ABNORMAL HIGH (ref 0–5)
SPECIFIC GRAVITY, URINE: 1.027 (ref 1.005–1.030)
pH: 5 (ref 5.0–8.0)

## 2018-05-05 LAB — LIPASE, BLOOD: Lipase: 31 U/L (ref 11–51)

## 2018-05-05 LAB — COMPREHENSIVE METABOLIC PANEL
ALK PHOS: 54 U/L (ref 38–126)
ALT: 18 U/L (ref 0–44)
AST: 15 U/L (ref 15–41)
Albumin: 4.3 g/dL (ref 3.5–5.0)
Anion gap: 9 (ref 5–15)
BUN: 13 mg/dL (ref 6–20)
CALCIUM: 9.5 mg/dL (ref 8.9–10.3)
CO2: 24 mmol/L (ref 22–32)
Chloride: 105 mmol/L (ref 98–111)
Creatinine, Ser: 0.52 mg/dL (ref 0.44–1.00)
GFR calc Af Amer: >60 mL/min (ref >60)
GFR calc non Af Amer: >60 mL/min (ref >60)
Glucose, Bld: 129 mg/dL — ABNORMAL HIGH (ref 70–99)
Potassium: 3.6 mmol/L (ref 3.5–5.1)
Sodium: 138 mmol/L (ref 135–145)
TOTAL PROTEIN: 7.8 g/dL (ref 6.5–8.1)
Total Bilirubin: 0.4 mg/dL (ref 0.3–1.2)

## 2018-05-05 LAB — I-STAT BETA HCG BLOOD, ED (MC, WL, AP ONLY): I-stat hCG, quantitative: <5 m[IU]/mL (ref <5)

## 2018-05-05 MED ORDER — SODIUM CHLORIDE 0.9% FLUSH: 3.0000 mL | Freq: Once | INTRAVENOUS | Status: DC

## 2018-05-05 MED ORDER — OXYCODONE-ACETAMINOPHEN 5-325 MG PO TABS
1.0000 | ORAL_TABLET | Freq: Once | ORAL | Status: DC
  Filled 2018-05-05: qty 1

## 2018-05-05 MED ORDER — ONDANSETRON 4 MG PO TBDP
4.0000 mg | ORAL_TABLET | Freq: Once | ORAL | Status: AC
  Administered 2018-05-05: 4 mg via ORAL
  Filled 2018-05-05: qty 1

## 2018-05-05 NOTE — ED Provider Notes (Signed)
[facility] DEPT Provider Note   CSN: [id number] Arrival date & time: 05/05/18  2036     History   Chief Complaint Chief Complaint  Patient presents with  . Abdominal Pain    HPI Ashley Lane is a 43 y.o. female.  The history is provided by the patient and medical records.  Abdominal Pain  Associated symptoms: nausea      43 year old female with history of anemia, chronic migraines, menorrhagia, presenting to the ED with abdominal pain.  This has been an ongoing issue for about a month now.  She reports initially pain began on her left upper abdomen.  She saw her primary care doctor for this who scheduled her for a CT scan that was grossly normal aside from enlarged liver.  She then developed a faint rash on her left upper abdomen and she is somewhat disputing this diagnosis.  Last week she has now had right upper quadrant pain.  She was back to her primary care doctor who sent her for an ultrasound of her gallbladder, no stones but enlarged liver once again.  States she is scheduled for a HIDA scan in the morning but got acutely worse tonight so her doctor told her to come to the ER.  States she is she is getting very frustrated with her symptoms and wants to know what is going on.  States she is hardly eating because she does not have an appetite.  She has had some intermittent nausea but no vomiting.  Bowel movements have been loose but not quite diarrhea.  She has been feeling feverish but is afebrile here.  No prior abdominal surgeries.  Past Medical History:  Diagnosis Date  . AMA (advanced maternal age) multigravida 31+   . Anemia    hx  . H/O varicella   . Hx of pyelonephritis     Patient Active Problem List   Diagnosis Date Noted  . Chronic migraine 09/23/2017  . Menorrhagia 12/16/2012    Past Surgical History:  Procedure Laterality Date  . NO PAST SURGERIES       OB History    Gravida  3   Para  3   Term  3   Preterm  0     AB  0   Living  3     SAB  0   TAB  0   Ectopic  0   Multiple  0   Live Births  3            Home Medications    Prior to Admission medications   Not on File    Family History Family History  Problem Relation Age of Onset  . Hypertension Mother   . Thyroid disease Mother   . Cancer Sister        cervical  . Hypertension Maternal Grandmother   . Thyroid disease Maternal Grandmother   . Hypertension Maternal Grandfather   . Other Father        died in car accident    Social History Social History   Tobacco Use  . Smoking status: Never Smoker  . Smokeless tobacco: Never Used  Substance Use Topics  . Alcohol use: No  . Drug use: No     Allergies   Latex   Review of Systems Review of Systems  Gastrointestinal: Positive for abdominal pain and nausea.  All other systems reviewed and are negative.    Physical Exam Updated Vital Signs BP (!) 134/91 (BP Location:  Left Arm)   Pulse 91   Temp 98.9 F (37.2 C) (Oral)   Resp 16   Ht _0  (1.676 m)   Wt 104 kg   SpO2 99%   BMI 37.01 kg/m   Physical Exam Vitals signs and nursing note reviewed.  Constitutional:      Appearance: She is well-developed.  HENT:     Head: Normocephalic and atraumatic.  Eyes:     Conjunctiva/sclera: Conjunctivae normal.     Pupils: Pupils are equal, round, and reactive to light.  Neck:     Musculoskeletal: Normal range of motion.  Cardiovascular:     Rate and Rhythm: Normal rate and regular rhythm.     Heart sounds: Normal heart sounds.  Pulmonary:     Effort: Pulmonary effort is normal.     Breath sounds: Normal breath sounds.  Abdominal:     General: Bowel sounds are normal.     Palpations: Abdomen is soft.     Tenderness: There is abdominal tenderness in the right upper quadrant.  Musculoskeletal: Normal range of motion.  Skin:    General: Skin is warm and dry.  Neurological:     Mental Status: She is alert and oriented to person, place, and time.       ED Treatments / Results  Labs (all labs ordered are listed, but only abnormal results are displayed) Labs Reviewed  COMPREHENSIVE METABOLIC PANEL - Abnormal; Notable for the following components:      Result Value   Glucose, Bld 129 (*)    All other components within normal limits  CBC - Abnormal; Notable for the following components:   WBC 11.2 (*)    All other components within normal limits  URINALYSIS, ROUTINE W REFLEX MICROSCOPIC - Abnormal; Notable for the following components:   APPearance HAZY (*)    Hgb urine dipstick LARGE (*)    Ketones, ur 20 (*)    Protein, ur 100 (*)    Leukocytes,Ua LARGE (*)    RBC / HPF >50 (*)    Bacteria, UA RARE (*)    All other components within normal limits  LIPASE, BLOOD  I-STAT BETA HCG BLOOD, ED ([facility], [facility], [facility] ONLY)    EKG None  Radiology No results found.  Procedures Procedures (including critical care time)  Medications Ordered in ED Medications  oxyCODONE-acetaminophen (PERCOCET/ROXICET) 5-325 MG per tablet 1 tablet (1 tablet Oral Not Given 05/05/18 2335)  ondansetron (ZOFRAN-ODT) disintegrating tablet 4 mg (4 mg Oral Given 05/05/18 2335)     Initial Impression / Assessment and Plan / ED Course  I have reviewed the triage vital signs and the nursing notes.  Pertinent labs & imaging results that were available during my care of the patient were reviewed by me and considered in my medical decision making (see chart for details).  43 year old female here with right upper quadrant abdominal pain.  Has been having abdominal pain for about a month now.  Saw her PCP and has already had CT scan on 04/05/18 and RUQ [place] yesterday that was grossly normal aside from enlarged/fatty liver.  Reports symptoms worsened today and PCP advised her to come here.  Patient is afebrile and nontoxic.  She is in no acute distress.  She does have tenderness in the right upper quadrant without Murphy sign.  Remainder of her abdomen is soft and benign.   Bowel sounds are normal and she does not appear distended.  Labs reviewed from today and are grossly reassuring aside from  a very mild leukocytosis.  Her UA appears infectious, she is currently on course of Keflex prescribed by her primary care doctor for this.  She denies any current urinary symptoms and does not have any flank pain/CVA tenderness.  Long discussion with patient and her husband--I have advised that I cannot get HIDA scan in the middle of the night.  Discussed options to repeat her ultrasound, however without any acute changes in her LFTs, alk phos, her bilirubin I am not sure this will change her management much-- declined repeat US.  Patient also brought up that her doctor had question about treating her for mono, I have offered formal testing for this here, however again explained that this is a viral process and is unlikely to change her acute management here-- patient declined this as well.  I honestly feel that her best course of action is to have symptom control and follow-up for her HIDA scan in the morning as scheduled, patient agreeable.  Given Rx percocet and zofran, first dose given here.  She will follow-up closely with her primary care doctor.  Return here for any new/acute changes.  Final Clinical Impressions(s) / ED Diagnoses   Final diagnoses:  RUQ abdominal pain    ED Discharge Orders         Ordered    oxyCODONE-acetaminophen (PERCOCET) 5-325 MG tablet  Every 4 hours PRN     05/06/18 0006    ondansetron (ZOFRAN ODT) 4 MG disintegrating tablet  Every 8 hours PRN     05/06/18 0006           Larene Pickett, PA-C 05/06/18 0049    Drenda Freeze, MD 05/07/18 1032

## 2018-05-05 NOTE — ED Triage Notes (Signed)
Pt states that she has been having possible gallbladder issues x 2 weeks.  Pt has been seeing her PCP and is scheduled for a hidascan tomorrow.  Pt states her condition has worsened and her PCP told her to come into ER.  C/o chills, worsening abd pain, nausea, diarrhea.

## 2018-05-06 ENCOUNTER — Ambulatory Visit (HOSPITAL_COMMUNITY)
Admission: RE | Admit: 2018-05-06 | Discharge: 2018-05-06 | Disposition: A | Discharge status: Home / Self Care | Payer: BLUE CROSS/BLUE SHIELD | Source: Ambulatory Visit | Attending: Physician Assistant | Admitting: Physician Assistant

## 2018-05-06 DIAGNOSIS — R1011 Right upper quadrant pain: Secondary | ICD-10-CM | POA: Insufficient documentation

## 2018-05-06 MED ORDER — ONDANSETRON 4 MG PO TBDP: 4.0000 mg | ORAL_TABLET | Freq: Three times a day (TID) | ORAL | 0 refills | Status: DC | PRN

## 2018-05-06 MED ORDER — OXYCODONE-ACETAMINOPHEN 5-325 MG PO TABS: 1.0000 | ORAL_TABLET | ORAL | 0 refills | Status: DC | PRN

## 2018-05-06 MED ORDER — TECHNETIUM TC 99M MEBROFENIN IV KIT
5.4800 | PACK | Freq: Once | INTRAVENOUS | Status: AC | PRN
  Administered 2018-05-06: 5.48 via INTRAVENOUS

## 2018-05-06 NOTE — Discharge Instructions (Signed)
Follow-up for your HIDA scan tomorrow at [facility]. Can use percocet/zofran for symptomatic control. Follow-up closely with your primary care doctor-- they should be able to review labs from today's visit. Return here for any new/acute changes.

## 2018-05-11 ENCOUNTER — Encounter: Payer: Self-pay | Admitting: Women's Health

## 2018-05-11 ENCOUNTER — Ambulatory Visit: Payer: BLUE CROSS/BLUE SHIELD | Admitting: Women's Health

## 2018-05-11 VITALS — BP 118/80 | Temp 98.1°F | Ht 65.0 in | Wt 225.0 lb

## 2018-05-11 DIAGNOSIS — R5383 Other fatigue: Secondary | ICD-10-CM | POA: Diagnosis not present

## 2018-05-11 DIAGNOSIS — R350 Frequency of micturition: Secondary | ICD-10-CM | POA: Diagnosis not present

## 2018-05-11 DIAGNOSIS — R1031 Right lower quadrant pain: Secondary | ICD-10-CM

## 2018-05-11 NOTE — Patient Instructions (Signed)
Nausea, Adult  Nausea is the feeling that you have an upset stomach or that you are about to vomit. Nausea on its own is not usually a serious concern, but it may be an early sign of a more serious medical problem. As nausea gets worse, it can lead to vomiting. If vomiting develops, or if you are not able to drink enough fluids, you are at risk of becoming dehydrated. Dehydration can make you tired and thirsty, cause you to have a dry mouth, and decrease how often you urinate. Older adults and people with other diseases or a weak disease-fighting system (immune system) are at higher risk for dehydration. The main goals of treating your nausea are:   To relieve your nausea.   To limit repeated nausea episodes.   To prevent vomiting and dehydration.  Follow these instructions at home:  Watch your symptoms for any changes. Tell your health care provider about them. Follow these instructions as told by your health care provider.  Eating and drinking          Take an oral rehydration solution (ORS). This is a drink that is sold at pharmacies and retail stores.   Drink clear fluids slowly and in small amounts as you are able. Clear fluids include water, ice chips, low-calorie sports drinks, and fruit juice that has water added (diluted fruit juice).   Eat bland, easy-to-digest foods in small amounts as you are able. These foods include bananas, applesauce, rice, lean meats, toast, and crackers.   Avoid drinking fluids that contain a lot of sugar or caffeine, such as energy drinks, sports drinks, and soda.   Avoid alcohol.   Avoid spicy or fatty foods.  General instructions   Take over-the-counter and prescription medicines only as told by your health care provider.   Rest at home while you recover.   Drink enough fluid to keep your urine pale yellow.   Breathe slowly and deeply when you feel nauseous.   Avoid smelling things that have strong odors.   Wash your hands often using soap and water. If soap and  water are not available, use hand sanitizer.   Make sure that all people in your household wash their hands well and often.   Keep all follow-up visits as told by your health care provider. This is important.  Contact a health care provider if:   Your nausea gets worse.   Your nausea does not go away after two days.   You vomit.   You cannot drink fluids without vomiting.   You have any of the following:  ? New symptoms.  ? A fever.  ? A headache.  ? Muscle cramps.  ? A rash.  ? Pain while urinating.   You feel light-headed or dizzy.  Get help right away if:   You have pain in your chest, neck, arm, or jaw.   You feel extremely weak or you faint.   You have vomit that is bright red or looks like coffee grounds.   You have bloody or black stools or stools that look like tar.   You have a severe headache, a stiff neck, or both.   You have severe pain, cramping, or bloating in your abdomen.   You have difficulty breathing or are breathing very quickly.   Your heart is beating very quickly.   Your skin feels cold and clammy.   You feel confused.   You have signs of dehydration, such as:  ? Dark urine, very   little urine, or no urine.  ? Cracked lips.  ? Dry mouth.  ? Sunken eyes.  ? Sleepiness.  ? Weakness.  These symptoms may represent a serious problem that is an emergency. Do not wait to see if the symptoms will go away. Get medical help right away. Call your local emergency services (911 in the [place] Do not drive yourself to the hospital.  Summary   Nausea is the feeling that you have an upset stomach or that you are about to vomit. Nausea on its own is not usually a serious concern, but it may be an early sign of a more serious medical problem.   If vomiting develops, or if you are not able to drink enough fluids, you are at risk of becoming dehydrated.   Follow recommendations for eating and drinking and take over-the-counter and prescription medicines only as told by your health care  provider.   Contact a health care provider right away if your symptoms worsen or you have new symptoms.   Keep all follow-up visits as told by your health care provider. This is important.  This information is not intended to replace advice given to you by your health care provider. Make sure you discuss any questions you have with your health care provider.  Document Released: 04/17/2004 Document Revised: 08/18/2017 Document Reviewed: 08/18/2017  Elsevier Interactive Patient Education  2019 [employer]

## 2018-05-11 NOTE — Progress Notes (Signed)
43 year old MWF G3, P3 presents with several complaints.  States feels very frustrated that she feels something is not right but the problem has not been found.  Has had right and left sided abdominal pain and low right-sided back pain over the past 2 months with negative CT scan, HIDA scan showed sluggish gallbladder but no stones, questionable gallbladder disease but surgeon does not think that is the problem.  Has had continual problems with nausea, no appetite has lost over 15 pounds in the past 2 months due to this.  Nausea is consistent, does not increase after eating.  Has also had reflux in the past 2 months which she has never had prior.  Has had some UTI type symptoms. Pain initially was left upper neck radiating to abdomen with a faint rash,  questionable shingles diagnosis  but now does not think that was actually shingles.  Pain is now more on the right abdomen lateral to umbilicus.  States that time she feels she has a fever and generally not feeling well with no energy and is having difficulty completing daily living tasks.  States is never had anything like this in the past.  Having regular monthly cycles, pain has increased with cycles.  Exam: Appears uncomfortable, no CVAT pain is lower sacral area.  Abdomen soft without rebound radiation and lower abdomen pain with palpation to right lateral abdomen.  External genitalia within normal limits, speculum exam no visible blood, discharge or erythema, no odor noted.  Bimanual no CMT or adnexal tenderness discomfort appears to be higher than ovary. UA: Negative nitrites, negative leukocytes, 0-5 WBCs, no RBCs, 10-20 squamous epithelials, moderate bacteria.  Persistent abdominal pain with nausea and anorexia  Plan: Schedule ultrasound, TSH, T4, urine culture pending.  Ways to decrease nausea discussed.

## 2018-05-12 LAB — TSH: TSH: 0.61 mIU/L

## 2018-05-12 LAB — T4: T4 TOTAL: 7.8 ug/dL (ref 5.1–11.9)

## 2018-05-13 LAB — URINALYSIS, COMPLETE W/RFL CULTURE
Bilirubin Urine: NEGATIVE
Glucose, UA: NEGATIVE
Hgb urine dipstick: NEGATIVE
Hyaline Cast: NONE SEEN /LPF
Ketones, ur: NEGATIVE
Leukocyte Esterase: NEGATIVE
Nitrites, Initial: NEGATIVE
Protein, ur: NEGATIVE
RBC / HPF: NONE SEEN /HPF (ref 0–2)
Specific Gravity, Urine: 1.025 (ref 1.001–1.03)
pH: 7 (ref 5.0–8.0)

## 2018-05-13 LAB — URINE CULTURE
MICRO NUMBER: 214582
RESULT: NO GROWTH
SPECIMEN QUALITY:: ADEQUATE

## 2018-05-13 LAB — CULTURE INDICATED

## 2018-05-19 ENCOUNTER — Ambulatory Visit: Payer: BLUE CROSS/BLUE SHIELD | Admitting: Women's Health

## 2018-05-19 ENCOUNTER — Other Ambulatory Visit: Payer: Self-pay | Admitting: Women's Health

## 2018-05-19 ENCOUNTER — Encounter: Payer: Self-pay | Admitting: Women's Health

## 2018-05-19 ENCOUNTER — Ambulatory Visit (INDEPENDENT_AMBULATORY_CARE_PROVIDER_SITE_OTHER): Payer: BLUE CROSS/BLUE SHIELD

## 2018-05-19 VITALS — BP 120/80

## 2018-05-19 DIAGNOSIS — R1031 Right lower quadrant pain: Secondary | ICD-10-CM | POA: Diagnosis not present

## 2018-05-19 DIAGNOSIS — R1030 Lower abdominal pain, unspecified: Secondary | ICD-10-CM

## 2018-05-19 DIAGNOSIS — N831 Corpus luteum cyst of ovary, unspecified side: Secondary | ICD-10-CM

## 2018-05-19 NOTE — Progress Notes (Signed)
43 year old MWF G3, P3 presents with for ultrasound.  Has had abdominal pain on both right and left side for the past 2 months causing nausea, anorexia, has lost 15 pounds over the past 2 months.  Nausea was constant but now is decreasing and is generally feeling better.  Pain initially was more on the left side that radiated around abdomen, questionable shingles diagnosis pain is now more right-sided lateral to umbilicus but has decreased.  Had been constipated, now is having regular daily bowel movements and is passing gas but states has more bloating/gas type discomfort than her usual.  Had a negative CT scan.  HIDA scan did show a sluggish gallbladder but no stones and surgeon did not feel that was the problem.  Currently reports feeling at least 50% better than before.  Monthly cycle which exacerbates the abdominal pain, vasectomy.  Exam: Appears well.  Ultrasound: T/V uterus anteverted homogeneous.  Endometrium within normal limits Tri layered, 14 mm.  Right ovary irregular thick-walled corpus luteal cyst positive CFD to the periphery 20 x 22 mm.  Left ovary normal echo, located against wall of uterus unable to produce move ability with palpation due to location.  Negative cul-de-sac.  Probable normal GYN ultrasound Persistent generalized abdominal pain  Plan: Reviewed normality of corpus luteal cyst, questionable left ovary adhered to uterus or just located behind uterus.  Has never had abdominal surgery.  Since feeling better will watch at this time, encouraged MiraLAX as needed if any constipation, regular daily exercise of walking, increase fiber rich foods in diet.  Directed to call if any further problems.

## 2018-05-19 NOTE — Patient Instructions (Signed)
Carbohydrate Counting for Diabetes Mellitus, Adult  Carbohydrate counting is a method of keeping track of how many carbohydrates you eat. Eating carbohydrates naturally increases the amount of sugar (glucose) in the blood. Counting how many carbohydrates you eat helps keep your blood glucose within normal limits, which helps you manage your diabetes (diabetes mellitus). It is important to know how many carbohydrates you can safely have in each meal. This is different for every person. A diet and nutrition specialist (registered dietitian) can help you make a meal plan and calculate how many carbohydrates you should have at each meal and snack. Carbohydrates are found in the following foods:  Grains, such as breads and cereals.  Dried beans and soy products.  Starchy vegetables, such as potatoes, peas, and corn.  Fruit and fruit juices.  Milk and yogurt.  Sweets and snack foods, such as cake, cookies, candy, chips, and soft drinks. How do I count carbohydrates? There are two ways to count carbohydrates in food. You can use either of the methods or a combination of both. Reading "Nutrition Facts" on packaged food The "Nutrition Facts" list is included on the labels of almost all packaged foods and beverages in the [place] It includes:  The serving size.  Information about nutrients in each serving, including the grams (g) of carbohydrate per serving. To use the "Nutrition Facts":  Decide how many servings you will have.  Multiply the number of servings by the number of carbohydrates per serving.  The resulting number is the total amount of carbohydrates that you will be having. Learning standard serving sizes of other foods When you eat carbohydrate foods that are not packaged or do not include "Nutrition Facts" on the label, you need to measure the servings in order to count the amount of carbohydrates:  Measure the foods that you will eat with a food scale or measuring cup, if needed.   Decide how many standard-size servings you will eat.  Multiply the number of servings by 15. Most carbohydrate-rich foods have about 15 g of carbohydrates per serving. ? For example, if you eat 8 oz (170 g) of strawberries, you will have eaten 2 servings and 30 g of carbohydrates (2 servings x 15 g = 30 g).  For foods that have more than one food mixed, such as soups and casseroles, you must count the carbohydrates in each food that is included. The following list contains standard serving sizes of common carbohydrate-rich foods. Each of these servings has about 15 g of carbohydrates:   hamburger bun or  English muffin.   oz (15 mL) syrup.   oz (14 g) jelly.  1 slice of bread.  1 six-inch tortilla.  3 oz (85 g) cooked rice or pasta.  4 oz (113 g) cooked dried beans.  4 oz (113 g) starchy vegetable, such as peas, corn, or potatoes.  4 oz (113 g) hot cereal.  4 oz (113 g) mashed potatoes or  of a large baked potato.  4 oz (113 g) canned or frozen fruit.  4 oz (120 mL) fruit juice.  4-6 crackers.  6 chicken nuggets.  6 oz (170 g) unsweetened dry cereal.  6 oz (170 g) plain fat-free yogurt or yogurt sweetened with artificial sweeteners.  8 oz (240 mL) milk.  8 oz (170 g) fresh fruit or one small piece of fruit.  24 oz (680 g) popped popcorn. Example of carbohydrate counting Sample meal  3 oz (85 g) chicken breast.  6 oz (170 g)   brown rice.  4 oz (113 g) corn.  8 oz (240 mL) milk.  8 oz (170 g) strawberries with sugar-free whipped topping. Carbohydrate calculation 1. Identify the foods that contain carbohydrates: ? Rice. ? Corn. ? Milk. ? Strawberries. 2. Calculate how many servings you have of each food: ? 2 servings rice. ? 1 serving corn. ? 1 serving milk. ? 1 serving strawberries. 3. Multiply each number of servings by 15 g: ? 2 servings rice x 15 g = 30 g. ? 1 serving corn x 15 g = 15 g. ? 1 serving milk x 15 g = 15 g. ? 1 serving  strawberries x 15 g = 15 g. 4. Add together all of the amounts to find the total grams of carbohydrates eaten: ? 30 g + 15 g + 15 g + 15 g = 75 g of carbohydrates total. Summary  Carbohydrate counting is a method of keeping track of how many carbohydrates you eat.  Eating carbohydrates naturally increases the amount of sugar (glucose) in the blood.  Counting how many carbohydrates you eat helps keep your blood glucose within normal limits, which helps you manage your diabetes.  A diet and nutrition specialist (registered dietitian) can help you make a meal plan and calculate how many carbohydrates you should have at each meal and snack. This information is not intended to replace advice given to you by your health care provider. Make sure you discuss any questions you have with your health care provider. Document Released: 03/10/2005 Document Revised: 09/17/2016 Document Reviewed: 08/22/2015 Elsevier Interactive Patient Education  2019 Elsevier Inc.  

## 2018-05-25 ENCOUNTER — Encounter: Payer: Self-pay | Admitting: Gastroenterology

## 2018-06-09 ENCOUNTER — Ambulatory Visit: Payer: BLUE CROSS/BLUE SHIELD | Admitting: Gastroenterology

## 2018-09-23 ENCOUNTER — Telehealth: Payer: Self-pay | Admitting: *Deleted

## 2018-09-23 ENCOUNTER — Other Ambulatory Visit: Payer: Self-pay

## 2018-09-23 NOTE — Telephone Encounter (Signed)
Patient called requesting to speak with Izora Gala regarding history of weight loss and fatigue has mentioned to her in past visit. Patient did say she has followed up with PCP regarding this as well. I called patient and left a message on voicemail she can schedule tele-visit with Izora Gala.

## 2018-09-27 ENCOUNTER — Ambulatory Visit: Payer: BC Managed Care – PPO | Admitting: Women's Health

## 2018-09-27 ENCOUNTER — Other Ambulatory Visit: Payer: Self-pay

## 2018-09-27 ENCOUNTER — Encounter: Payer: Self-pay | Admitting: Women's Health

## 2018-09-27 VITALS — BP 120/78 | Wt 223.0 lb

## 2018-09-27 DIAGNOSIS — R102 Pelvic and perineal pain: Secondary | ICD-10-CM | POA: Diagnosis not present

## 2018-09-27 NOTE — Progress Notes (Signed)
43 year old MWF G3, P3 presents with complaint of intermittent lateral right and left side pain for the past 6 months with all negative testing - CT scans, ultrasounds, colonoscopy, endoscopy.  HIDA scan did show a sluggish gallbladder but no stones and surgeon did not feel cholecystectomy would alleviate the problem.  States is feeling somewhat better, primary care is thinking possible anxiety causing pain.  Primary care started her on Prozac but only took 2 days did not feel well stopped..  Monthly cycles exacerbate lateral and cramping pelvic type pain.  Vasectomy.  Initially shingles was questioned for left lateral side pain.  Has had increased stress over the past year daughter had a questionable cardiac problem.  Had ultrasound here 05/19/2018 that showed a corpus luteal cyst 2 cm.  Reports had a large cyst greater than 15 years ago and was scheduled for surgery for removal that resolved on its own.  Worried about weight loss, weight is now stable at 225 had lost initially approximately 20 pounds in 1 month.  Weight had been at 245 for greater than 5 years.  Weight today 223.  Questions weight loss related to ovarian cancer?  09/2017 hemoglobin A1c 7, was checked at primary care in January and it was 5.8 .  Scientist, forensic, workload greatly decreased.  Exam: Appears well, comfortable, well groomed.  Persistent lateral right and left side pain Dysmenorrhea  Plan: Options reviewed, progestin only pill, declined, trying a different anti-anxiety medication, declined, repeat ultrasound, will schedule.  Reviewed most likely not ovarian cancer.  Reviewed weight is stable, congratulated on the 20 pound weight less even though quickly it has decreased her hemoglobin A1c to a safe level, continue to consume low-carb diet with low glycemic index.

## 2018-09-27 NOTE — Patient Instructions (Signed)
Norethindrone tablets (contraception) What is this medicine? NORETHINDRONE (nor eth IN drone) is an oral contraceptive. The product contains a female hormone known as a progestin. It is used to prevent pregnancy. This medicine may be used for other purposes; ask your health care provider or pharmacist if you have questions. COMMON BRAND NAME(S): Camila, Deblitane 28-Day, Errin, Heather, Jencycla, Jolivette, Lyza, Nor-QD, Nora-BE, Norlyroc, Ortho Micronor, Sharobel 28-Day What should I tell my health care provider before I take this medicine? They need to know if you have any of these conditions:  blood vessel disease or blood clots  breast, cervical, or vaginal cancer  diabetes  heart disease  kidney disease  liver disease  mental depression  migraine  seizures  stroke  vaginal bleeding  an unusual or allergic reaction to norethindrone, other medicines, foods, dyes, or preservatives  pregnant or trying to get pregnant  breast-feeding How should I use this medicine? Take this medicine by mouth with a glass of water. You may take it with or without food. Follow the directions on the prescription label. Take this medicine at the same time each day and in the order directed on the package. Do not take your medicine more often than directed. Contact your pediatrician regarding the use of this medicine in children. Special care may be needed. This medicine has been used in female children who have started having menstrual periods. A patient package insert for the product will be given with each prescription and refill. Read this sheet carefully each time. The sheet may change frequently. Overdosage: If you think you have taken too much of this medicine contact a poison control center or emergency room at once. NOTE: This medicine is only for you. Do not share this medicine with others. What if I miss a dose? Try not to miss a dose. Every time you miss a dose or take a dose late  your chance of pregnancy increases. When 1 pill is missed (even if only 3 hours late), take the missed pill as soon as possible and continue taking a pill each day at the regular time (use a back up method of birth control for the next 48 hours). If more than 1 dose is missed, use an additional birth control method for the rest of your pill pack until menses occurs. Contact your health care professional if more than 1 dose has been missed. What may interact with this medicine? Do not take this medicine with any of the following medications:  amprenavir or fosamprenavir  bosentan This medicine may also interact with the following medications:  antibiotics or medicines for infections, especially rifampin, rifabutin, rifapentine, and griseofulvin, and possibly penicillins or tetracyclines  aprepitant  barbiturate medicines, such as phenobarbital  carbamazepine  felbamate  modafinil  oxcarbazepine  phenytoin  ritonavir or other medicines for HIV infection or AIDS  St. John's wort  topiramate This list may not describe all possible interactions. Give your health care provider a list of all the medicines, herbs, non-prescription drugs, or dietary supplements you use. Also tell them if you smoke, drink alcohol, or use illegal drugs. Some items may interact with your medicine. What should I watch for while using this medicine? Visit your doctor or health care professional for regular checks on your progress. You will need a regular breast and pelvic exam and Pap smear while on this medicine. Use an additional method of birth control during the first cycle that you take these tablets. If you have any reason to think you   are pregnant, stop taking this medicine right away and contact your doctor or health care professional. If you are taking this medicine for hormone related problems, it may take several cycles of use to see improvement in your condition. This medicine does not protect you  against HIV infection (AIDS) or any other sexually transmitted diseases. What side effects may I notice from receiving this medicine? Side effects that you should report to your doctor or health care professional as soon as possible:  breast tenderness or discharge  pain in the abdomen, chest, groin or leg  severe headache  skin rash, itching, or hives  sudden shortness of breath  unusually weak or tired  vision or speech problems  yellowing of skin or eyes Side effects that usually do not require medical attention (report to your doctor or health care professional if they continue or are bothersome):  changes in sexual desire  change in menstrual flow  facial hair growth  fluid retention and swelling  headache  irritability  nausea  weight gain or loss This list may not describe all possible side effects. Call your doctor for medical advice about side effects. You may report side effects to FDA at 1-800-FDA-1088. Where should I keep my medicine? Keep out of the reach of children. Store at room temperature between 15 and 30 degrees C (59 and 86 degrees F). Throw away any unused medicine after the expiration date. NOTE: This sheet is a summary. It may not cover all possible information. If you have questions about this medicine, talk to your doctor, pharmacist, or health care provider.  2020 Elsevier/Gold Standard (2011-11-28 16:41:35) Sertraline oral solution What is this medicine? SERTRALINE (SER tra leen) is used to treat depression. It may also be used to treat obsessive compulsive disorder, panic disorder, post-trauma stress, premenstrual dysphoric disorder (PMDD) or social anxiety. This medicine may be used for other purposes; ask your health care provider or pharmacist if you have questions. COMMON BRAND NAME(S): Zoloft What should I tell my health care provider before I take this medicine? They need to know if you have any of these conditions:  bleeding  disorders  bipolar disorder or a family history of bipolar disorder  glaucoma  heart disease  high blood pressure  history of irregular heartbeat  history of low levels of calcium, magnesium, or potassium in the blood  if you often drink alcohol  liver disease  receiving electroconvulsive therapy  seizures  suicidal thoughts, plans, or attempt; a previous suicide attempt by you or a family member  take medicines that treat or prevent blood clots  thyroid disease  an unusual or allergic reaction to sertraline, other medicines, foods, dyes, or preservatives  pregnant or trying to get pregnant  breast-feeding How should I use this medicine? Take this medicine by mouth. Follow the directions on the prescription label. Before taking your dose, you need to dilute the solution in a beverage. Measure your medicine dose using the dropper in the bottle. Next, place the measured dose in at least 4 ounces (one-half cup) of water, ginger-ale, lemon-lime soda, lemonade or orange juice and mix. Do not mix in grapefruit juice or in any other liquids other than those listed. Drink all of mixed liquid immediately. Do not mix the dose and store it for later. It must be taken right away. You may take this medicine with or without food. Take your medicine at regular intervals. Do not take your medicine more often than directed. Do not stop taking this  medicine suddenly except upon the advice of your doctor. Stopping this medicine too quickly may cause serious side effects or your condition may worsen. A special MedGuide will be given to you by the pharmacist with each prescription and refill. Be sure to read this information carefully each time. Talk to your pediatrician regarding the use of this medicine in children. While this drug may be prescribed for children as Charday Capetillo as 7 years for selected conditions, precautions do apply. Overdosage: If you think you have taken too much of this medicine  contact a poison control center or emergency room at once. NOTE: This medicine is only for you. Do not share this medicine with others. What if I miss a dose? If you miss a dose, take it as soon as you can. If it is almost time for your next dose, take only that dose. Do not take double or extra doses. What may interact with this medicine? Do not take this medicine with any of the following medications:  cisapride  dronedarone  linezolid  MAOIs like Carbex, Eldepryl, Marplan, Nardil, and Parnate  methylene blue (injected into a vein)  pimozide  thioridazine This medicine may also interact with the following medications:  alcohol  amphetamines  aspirin and aspirin-like medicines  certain medicines for depression, anxiety, or psychotic disturbances  certain medicines for fungal infections like ketoconazole, fluconazole, posaconazole, and itraconazole  certain medicines for irregular heart beat like flecainide, quinidine, propafenone  certain medicines for migraine headaches like almotriptan, eletriptan, frovatriptan, naratriptan, rizatriptan, sumatriptan, zolmitriptan  certain medicines for sleep  certain medicines for seizures like carbamazepine, valproic acid, phenytoin  certain medicines that treat or prevent blood clots like warfarin, enoxaparin, dalteparin  cimetidine  digoxin  diuretics  fentanyl  isoniazid  lithium  NSAIDs, medicines for pain and inflammation, like ibuprofen or naproxen  other medicines that prolong the QT interval (cause an abnormal heart rhythm) like dofetilide  rasagiline  safinamide  supplements like St. John's wort, kava kava, valerian  tolbutamide  tramadol  tryptophan This list may not describe all possible interactions. Give your health care provider a list of all the medicines, herbs, non-prescription drugs, or dietary supplements you use. Also tell them if you smoke, drink alcohol, or use illegal drugs. Some items  may interact with your medicine. What should I watch for while using this medicine? Tell your doctor if your symptoms do not get better or if they get worse. Visit your doctor or health care professional for regular checks on your progress. Because it may take several weeks to see the full effects of this medicine, it is important to continue your treatment as prescribed by your doctor. Patients and their families should watch out for new or worsening thoughts of suicide or depression. Also watch out for sudden changes in feelings such as feeling anxious, agitated, panicky, irritable, hostile, aggressive, impulsive, severely restless, overly excited and hyperactive, or not being able to sleep. If this happens, especially at the beginning of treatment or after a change in dose, call your health care professional. Bonita QuinYou may get drowsy or dizzy. Do not drive, use machinery, or do anything that needs mental alertness until you know how this medicine affects you. Do not stand or sit up quickly, especially if you are an older patient. This reduces the risk of dizzy or fainting spells. Alcohol may interfere with the effect of this medicine. Avoid alcoholic drinks. This medicine contains a high percentage of alcohol that may interact with medicines used to treat  alcohol abuse, like Antabuse (disulfiram). You should not take these medicines together. Your mouth may get dry. Chewing sugarless gum or sucking hard candy, and drinking plenty of water may help. Contact your doctor if the problem does not go away or is severe. Some products may contain alcohol. Ask your pharmacist or healthcare provider if this medicine contains alcohol. Be sure to tell all healthcare providers you are taking this medicine. Certain medicines, like metronidazole and disulfiram, can cause an unpleasant reaction when taken with alcohol. The reaction includes flushing, headache, nausea, vomiting, sweating, and increased thirst. The reaction can  last from 30 minutes to several hours. What side effects may I notice from receiving this medicine? Side effects that you should report to your doctor or health care professional as soon as possible:  allergic reactions like skin rash, itching or hives, swelling of the face, lips, or tongue  anxious  black, tarry stools  changes in vision  confusion  elevated mood, decreased need for sleep, racing thoughts, impulsive behavior  eye pain  fast, irregular heartbeat  feeling faint or lightheaded, falls  feeling agitated, angry, or irritable  hallucination, loss of contact with reality  loss of balance or coordination  loss of memory  painful or prolonged erections  restlessness, pacing, inability to keep still  seizures  stiff muscles  suicidal thoughts or other mood changes  trouble sleeping  unusual bleeding or bruising  unusually weak or tired  vomiting Side effects that usually do not require medical attention (report to your doctor or health care professional if they continue or are bothersome):  change in appetite or weight  change in sex drive or performance  diarrhea  increased sweating  indigestion, nausea  tremors This list may not describe all possible side effects. Call your doctor for medical advice about side effects. You may report side effects to FDA at 1-800-FDA-1088. Where should I keep my medicine? Keep out of the reach of children. Store at room temperature between 15 and 30 degrees C (59 and 86 degrees F). Throw away any unused medicine after the expiration date. NOTE: This sheet is a summary. It may not cover all possible information. If you have questions about this medicine, talk to your doctor, pharmacist, or health care provider.  2020 Elsevier/Gold Standard (2018-03-02 10:07:27)

## 2018-10-04 ENCOUNTER — Other Ambulatory Visit: Payer: Self-pay

## 2018-10-05 ENCOUNTER — Ambulatory Visit: Payer: BC Managed Care – PPO | Admitting: Women's Health

## 2018-10-05 ENCOUNTER — Ambulatory Visit (INDEPENDENT_AMBULATORY_CARE_PROVIDER_SITE_OTHER): Payer: BC Managed Care – PPO

## 2018-10-05 ENCOUNTER — Encounter: Payer: Self-pay | Admitting: Women's Health

## 2018-10-05 VITALS — BP 124/78

## 2018-10-05 DIAGNOSIS — R102 Pelvic and perineal pain: Secondary | ICD-10-CM

## 2018-10-05 DIAGNOSIS — R1032 Left lower quadrant pain: Secondary | ICD-10-CM

## 2018-10-05 NOTE — Progress Notes (Signed)
43 year old MWF G3, P3 presents for ultrasound.  09/27/2018 has had intermittent right lateral and left-sided pain for the past 6 months with all negative testing CT scans, ultrasound, colonoscopy and endoscopy.  States is feeling better this past week and almost canceled appointment .  Primary care felt she was having some anxiety which she now agrees of part of this could have been anxiety.  Monthly cycles which make the cramping more intense with cycle.  Vasectomy.  Shingles is what started all the left lateral side pain.  Has had some weight loss of 20-25 pounds in the past 6 months,  weight currently 223.    Exam: Appears well, smiling, ultrasound: T/V uterus anteverted normal size uterus no masses seen.  Secretory symmetrical endometrium no masses seen.  Right ovary within normal limit previously seen cystic structure resolved.  Left ovary mobile with 22 mm resolving corpus luteal cyst currently day 19 of cycle.  No free fluid.  Resolving left lateral abdominal pain Normal GYN ultrasound  Plan: Ultrasound report reviewed, reassured and normality of results, since is feeling better reassurance given will watch at this time return to office as needed.  Annual due will come back in September for annual exam or sooner if pain returns or changes.

## 2018-10-11 ENCOUNTER — Encounter: Payer: BLUE CROSS/BLUE SHIELD | Admitting: Women's Health

## 2018-11-01 ENCOUNTER — Other Ambulatory Visit: Payer: Self-pay | Admitting: Chiropractic Medicine

## 2018-11-01 ENCOUNTER — Ambulatory Visit
Admission: RE | Admit: 2018-11-01 | Discharge: 2018-11-01 | Disposition: A | Discharge status: Home / Self Care | Payer: BLUE CROSS/BLUE SHIELD | Source: Ambulatory Visit | Attending: Chiropractic Medicine | Admitting: Chiropractic Medicine

## 2018-11-01 DIAGNOSIS — M542 Cervicalgia: Secondary | ICD-10-CM

## 2018-11-01 DIAGNOSIS — R200 Anesthesia of skin: Secondary | ICD-10-CM

## 2018-11-01 DIAGNOSIS — R42 Dizziness and giddiness: Secondary | ICD-10-CM

## 2018-12-06 ENCOUNTER — Ambulatory Visit: Payer: BC Managed Care – PPO | Admitting: Women's Health

## 2018-12-06 ENCOUNTER — Encounter: Payer: Self-pay | Admitting: Women's Health

## 2018-12-06 ENCOUNTER — Other Ambulatory Visit: Payer: Self-pay

## 2018-12-06 VITALS — BP 124/80 | Ht 65.0 in | Wt 226.0 lb

## 2018-12-06 DIAGNOSIS — Z01419 Encounter for gynecological examination (general) (routine) without abnormal findings: Secondary | ICD-10-CM | POA: Diagnosis not present

## 2018-12-06 DIAGNOSIS — G4700 Insomnia, unspecified: Secondary | ICD-10-CM | POA: Insufficient documentation

## 2018-12-06 MED ORDER — ZOLPIDEM TARTRATE 10 MG PO TABS: 10.0000 mg | ORAL_TABLET | Freq: Every evening | ORAL | 0 refills | Status: DC | PRN

## 2018-12-06 NOTE — Progress Notes (Signed)
Ashley Lane 01-31-1976 [id number]    History:    Presents for annual exam.  Regular monthly cycles/vasectomy.  States some cycles are heavy flow others are light.  Denies any intermenstrual  Spotting.  Normal Pap and mammogram history.  2019 numerous office visits to specialists and here for abdominal pain all test were negative and states abdominal pain is less.  Has lost 20 pounds over the past year due to this.  Exercising on a regular basis.  Past medical history, past surgical history, family history and social history were all reviewed and documented in the EPIC chart.  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  Husband is a Psychologist, sport and exercise, retired [employer] boy.  ROS:  A ROS was performed and pertinent positives and negatives are included.  Exam:  Vitals:   12/06/18 1032  BP: 124/80  Weight: 226 lb (102.5 kg)  Height: 5\' 5"  (1.651 m)   Body mass index is 37.61 kg/m.   General appearance:  Normal Thyroid:  Symmetrical, normal in size, without palpable masses or nodularity. Respiratory  Auscultation:  Clear without wheezing or rhonchi Cardiovascular  Auscultation:  Regular rate, without rubs, murmurs or gallops  Edema/varicosities:  Not grossly evident Abdominal  Soft,nontender, without masses, guarding or rebound.  Liver/spleen:  No organomegaly noted  Hernia:  None appreciated  Skin  Inspection:  Grossly normal   Breasts: Examined lying and sitting.     Right: Without masses, retractions, discharge or axillary adenopathy.     Left: Without masses, retractions, discharge or axillary adenopathy. Gentitourinary   Inguinal/mons:  Normal without inguinal adenopathy  External genitalia:  Normal  BUS/Urethra/Skene's glands:  Normal  Vagina:  Normal  Cervix:  Normal  Uterus:  normal in size, shape and contour.  Midline and mobile  Adnexa/parametria:     Rt: Without masses or tenderness.   Lt: Without masses  or tenderness.  Anus and perineum: Normal  Digital rectal exam: Normal sphincter tone without palpated masses or tenderness  Assessment/Plan:  43 y.o. MWF G3, P3 for annual exam with complaint of insomnia/difficulty sleeping   Monthly cycles/vasectomy Abdominal pain-resolving Obesity Labs-primary care  Plan: Insomnia discussed, sleep hygiene reviewed, caffeine avoidance, routine, will try Ambien 10 mg reviewed possible risk of strange dreams, reviewed addictive and to use sparingly.  Congratulations given on weight loss, encouraged to continue regular exercise, biking, decreasing calorie/carbs, calcium rich foods, vitamin D 2000 daily encouraged.  SBEs, continue annual screening mammogram.  Pap normal 2019, new screening guidelines reviewed.    [place], 10:45 AM 12/06/2018

## 2018-12-06 NOTE — Patient Instructions (Signed)
Health Maintenance, Female Adopting a healthy lifestyle and getting preventive care are important in promoting health and wellness. Ask your health care provider about:  The right schedule for you to have regular tests and exams.  Things you can do on your own to prevent diseases and keep yourself healthy. What should I know about diet, weight, and exercise? Eat a healthy diet   Eat a diet that includes plenty of vegetables, fruits, low-fat dairy products, and lean protein.  Do not eat a lot of foods that are high in solid fats, added sugars, or sodium. Maintain a healthy weight Body mass index (BMI) is used to identify weight problems. It estimates body fat based on height and weight. Your health care provider can help determine your BMI and help you achieve or maintain a healthy weight. Get regular exercise Get regular exercise. This is one of the most important things you can do for your health. Most adults should:  Exercise for at least 150 minutes each week. The exercise should increase your heart rate and make you sweat (moderate-intensity exercise).  Do strengthening exercises at least twice a week. This is in addition to the moderate-intensity exercise.  Spend less time sitting. Even light physical activity can be beneficial. Watch cholesterol and blood lipids Have your blood tested for lipids and cholesterol at 42 years of age, then have this test every 5 years. Have your cholesterol levels checked more often if:  Your lipid or cholesterol levels are high.  You are older than 42 years of age.  You are at high risk for heart disease. What should I know about cancer screening? Depending on your health history and family history, you may need to have cancer screening at various ages. This may include screening for:  Breast cancer.  Cervical cancer.  Colorectal cancer.  Skin cancer.  Lung cancer. What should I know about heart disease, diabetes, and high blood  pressure? Blood pressure and heart disease  High blood pressure causes heart disease and increases the risk of stroke. This is more likely to develop in people who have high blood pressure readings, are of African descent, or are overweight.  Have your blood pressure checked: ? Every 3-5 years if you are 42-76 years of age. ? Every year if you are 42 years old or older. Diabetes Have regular diabetes screenings. This checks your fasting blood sugar level. Have the screening done:  Once every three years after age 42 if you are at a normal weight and have a low risk for diabetes.  More often and at a younger age if you are overweight or have a high risk for diabetes. What should I know about preventing infection? Hepatitis B If you have a higher risk for hepatitis B, you should be screened for this virus. Talk with your health care provider to find out if you are at risk for hepatitis B infection. Hepatitis C Testing is recommended for:  Everyone born from 42 through 1965.  Anyone with known risk factors for hepatitis C. Sexually transmitted infections (STIs)  Get screened for STIs, including gonorrhea and chlamydia, if: ? You are sexually active and are younger than 42 years of age. ? You are older than 42 years of age and your health care provider tells you that you are at risk for this type of infection. ? Your sexual activity has changed since you were last screened, and you are at increased risk for chlamydia or gonorrhea. Ask your health care provider if  you are at risk.  Ask your health care provider about whether you are at high risk for HIV. Your health care provider may recommend a prescription medicine to help prevent HIV infection. If you choose to take medicine to prevent HIV, you should first get tested for HIV. You should then be tested every 3 months for as long as you are taking the medicine. Pregnancy  If you are about to stop having your period (premenopausal) and  you may become pregnant, seek counseling before you get pregnant.  Take 400 to 800 micrograms (mcg) of folic acid every day if you become pregnant.  Ask for birth control (contraception) if you want to prevent pregnancy. Osteoporosis and menopause Osteoporosis is a disease in which the bones lose minerals and strength with aging. This can result in bone fractures. If you are 42 years old or older, or if you are at risk for osteoporosis and fractures, ask your health care provider if you should:  Be screened for bone loss.  Take a calcium or vitamin D supplement to lower your risk of fractures.  Be given hormone replacement therapy (HRT) to treat symptoms of menopause. Follow these instructions at home: Lifestyle  Do not use any products that contain nicotine or tobacco, such as cigarettes, e-cigarettes, and chewing tobacco. If you need help quitting, ask your health care provider.  Do not use street drugs.  Do not share needles.  Ask your health care provider for help if you need support or information about quitting drugs. Alcohol use  Do not drink alcohol if: ? Your health care provider tells you not to drink. ? You are pregnant, may be pregnant, or are planning to become pregnant.  If you drink alcohol: ? Limit how much you use to 0-1 drink a day. ? Limit intake if you are breastfeeding.  Be aware of how much alcohol is in your drink. In the [place] one drink equals one 12 oz bottle of beer (355 mL), one 5 oz glass of wine (148 mL), or one 1 oz glass of hard liquor (44 mL). General instructions  Schedule regular health, dental, and eye exams.  Stay current with your vaccines.  Tell your health care provider if: ? You often feel depressed. ? You have ever been abused or do not feel safe at home. Summary  Adopting a healthy lifestyle and getting preventive care are important in promoting health and wellness.  Follow your health care provider's instructions about healthy  diet, exercising, and getting tested or screened for diseases.  Follow your health care provider's instructions on monitoring your cholesterol and blood pressure. This information is not intended to replace advice given to you by your health care provider. Make sure you discuss any questions you have with your health care provider. Document Released: 09/23/2010 Document Revised: 03/03/2018 Document Reviewed: 03/03/2018 Elsevier Patient Education  [place]. Human Papillomavirus Quadrivalent Vaccine suspension for injection What is this medicine? HUMAN PAPILLOMAVIRUS VACCINE (HYOO muhn pap uh LOH muh vahy ruhs vak SEEN) is a vaccine. It is used to prevent infections of four types of the human papillomavirus. In women, the vaccine may lower your risk of getting cervical, vaginal, vulvar, or anal cancer and genital warts. In men, the vaccine may lower your risk of getting genital warts and anal cancer. You cannot get these diseases from the vaccine. This vaccine does not treat these diseases. This medicine may be used for other purposes; ask your health care provider or pharmacist if you  have questions. COMMON BRAND NAME(S): Gardasil What should I tell my health care provider before I take this medicine? They need to know if you have any of these conditions:  fever or infection  hemophilia  HIV infection or AIDS  immune system problems  low platelet count  an unusual reaction to Human Papillomavirus Vaccine, yeast, other medicines, foods, dyes, or preservatives  pregnant or trying to get pregnant  breast-feeding How should I use this medicine? This vaccine is for injection in a muscle on your upper arm or thigh. It is given by a health care professional. Dennis Bast will be observed for 15 minutes after each dose. Sometimes, fainting happens after the vaccine is given. You may be asked to sit or lie down during the 15 minutes. Three doses are given. The second dose is given 2 months after  the first dose. The last dose is given 4 months after the second dose. A copy of a Vaccine Information Statement will be given before each vaccination. Read this sheet carefully each time. The sheet may change frequently. Talk to your pediatrician regarding the use of this medicine in children. While this drug may be prescribed for children as Octaviano Mukai as 65 years of age for selected conditions, precautions do apply. Overdosage: If you think you have taken too much of this medicine contact a poison control center or emergency room at once. NOTE: This medicine is only for you. Do not share this medicine with others. What if I miss a dose? All 3 doses of the vaccine should be given within 6 months. Remember to keep appointments for follow-up doses. Your health care provider will tell you when to return for the next vaccine. Ask your health care professional for advice if you are unable to keep an appointment or miss a scheduled dose. What may interact with this medicine?  other vaccines This list may not describe all possible interactions. Give your health care provider a list of all the medicines, herbs, non-prescription drugs, or dietary supplements you use. Also tell them if you smoke, drink alcohol, or use illegal drugs. Some items may interact with your medicine. What should I watch for while using this medicine? This vaccine may not fully protect everyone. Continue to have regular pelvic exams and cervical or anal cancer screenings as directed by your doctor. The Human Papillomavirus is a sexually transmitted disease. It can be passed by any kind of sexual activity that involves genital contact. The vaccine works best when given before you have any contact with the virus. Many people who have the virus do not have any signs or symptoms. Tell your doctor or health care professional if you have any reaction or unusual symptom after getting the vaccine. What side effects may I notice from receiving this  medicine? Side effects that you should report to your doctor or health care professional as soon as possible:  allergic reactions like skin rash, itching or hives, swelling of the face, lips, or tongue  breathing problems  feeling faint or lightheaded, falls Side effects that usually do not require medical attention (report to your doctor or health care professional if they continue or are bothersome):  cough  dizziness  fever  headache  nausea  redness, warmth, swelling, pain, or itching at site where injected This list may not describe all possible side effects. Call your doctor for medical advice about side effects. You may report side effects to FDA at 1-800-FDA-1088. Where should I keep my medicine? This drug is  given in a hospital or clinic and will not be stored at home. NOTE: This sheet is a summary. It may not cover all possible information. If you have questions about this medicine, talk to your doctor, pharmacist, or health care provider.  2020 Elsevier/Gold Standard (2013-05-02 13:14:33)

## 2018-12-08 ENCOUNTER — Other Ambulatory Visit: Payer: Self-pay | Admitting: Chiropractic Medicine

## 2018-12-08 DIAGNOSIS — M542 Cervicalgia: Secondary | ICD-10-CM

## 2018-12-14 ENCOUNTER — Encounter: Payer: Self-pay | Admitting: Gynecology

## 2018-12-25 ENCOUNTER — Ambulatory Visit
Admission: RE | Admit: 2018-12-25 | Discharge: 2018-12-25 | Disposition: A | Discharge status: Home / Self Care | Payer: BLUE CROSS/BLUE SHIELD | Source: Ambulatory Visit | Attending: Chiropractic Medicine | Admitting: Chiropractic Medicine

## 2018-12-25 ENCOUNTER — Other Ambulatory Visit: Payer: Self-pay

## 2018-12-25 DIAGNOSIS — M542 Cervicalgia: Secondary | ICD-10-CM

## 2018-12-30 ENCOUNTER — Other Ambulatory Visit: Payer: BLUE CROSS/BLUE SHIELD

## 2019-02-21 ENCOUNTER — Telehealth: Payer: Self-pay | Admitting: *Deleted

## 2019-02-21 MED ORDER — ZOLPIDEM TARTRATE 10 MG PO TABS: 10.0000 mg | ORAL_TABLET | Freq: Every evening | ORAL | 0 refills | Status: DC | PRN

## 2019-02-21 NOTE — Telephone Encounter (Signed)
Patient called requesting refill on Ambien 10 mg tablet, last filled on 12/06/18.

## 2019-02-21 NOTE — Telephone Encounter (Signed)
Rx called in 

## 2019-02-21 NOTE — Telephone Encounter (Signed)
Okay for refill?  

## 2019-05-04 ENCOUNTER — Other Ambulatory Visit: Payer: Self-pay | Admitting: Women's Health

## 2019-05-04 DIAGNOSIS — Z1231 Encounter for screening mammogram for malignant neoplasm of breast: Secondary | ICD-10-CM

## 2019-06-13 ENCOUNTER — Ambulatory Visit
Admission: RE | Admit: 2019-06-13 | Discharge: 2019-06-13 | Disposition: A | Discharge status: Home / Self Care | Payer: BC Managed Care – PPO | Source: Ambulatory Visit | Attending: Women's Health | Admitting: Women's Health

## 2019-06-13 ENCOUNTER — Other Ambulatory Visit: Payer: Self-pay

## 2019-06-13 DIAGNOSIS — Z1231 Encounter for screening mammogram for malignant neoplasm of breast: Secondary | ICD-10-CM

## 2019-07-22 ENCOUNTER — Other Ambulatory Visit: Payer: Self-pay | Admitting: Otolaryngology

## 2019-07-22 DIAGNOSIS — H903 Sensorineural hearing loss, bilateral: Secondary | ICD-10-CM

## 2019-07-22 DIAGNOSIS — H905 Unspecified sensorineural hearing loss: Secondary | ICD-10-CM

## 2019-08-07 ENCOUNTER — Ambulatory Visit
Admission: RE | Admit: 2019-08-07 | Discharge: 2019-08-07 | Disposition: A | Discharge status: Home / Self Care | Payer: BC Managed Care – PPO | Source: Ambulatory Visit | Attending: Otolaryngology | Admitting: Otolaryngology

## 2019-08-07 DIAGNOSIS — H903 Sensorineural hearing loss, bilateral: Secondary | ICD-10-CM

## 2019-08-07 MED ORDER — GADOBENATE DIMEGLUMINE 529 MG/ML IV SOLN
20.0000 mL | Freq: Once | INTRAVENOUS | Status: AC | PRN
  Administered 2019-08-07: 20 mL via INTRAVENOUS

## 2019-08-19 ENCOUNTER — Other Ambulatory Visit: Payer: BC Managed Care – PPO

## 2019-09-05 ENCOUNTER — Ambulatory Visit: Payer: BC Managed Care – PPO | Attending: Family Medicine | Admitting: Physical Therapy

## 2019-09-05 ENCOUNTER — Encounter: Payer: Self-pay | Admitting: Physical Therapy

## 2019-09-05 ENCOUNTER — Other Ambulatory Visit: Payer: Self-pay

## 2019-09-05 DIAGNOSIS — M542 Cervicalgia: Secondary | ICD-10-CM | POA: Insufficient documentation

## 2019-09-05 DIAGNOSIS — R42 Dizziness and giddiness: Secondary | ICD-10-CM | POA: Diagnosis present

## 2019-09-05 DIAGNOSIS — R2681 Unsteadiness on feet: Secondary | ICD-10-CM | POA: Insufficient documentation

## 2019-09-05 NOTE — Therapy (Signed)
[facility] [address] [place], [place], [address] Phone: [phone]   Fax:  [phone]  Physical Therapy Evaluation  Patient Details  Name: Ashley Lane MRN: [id number] Date of Birth: [birth year] Referring Provider (PT): Dt. Newman Pies   Encounter Date: 09/05/2019   PT End of Session - 09/05/19 2116    Visit Number 1    Number of Visits 9    Date for PT Re-Evaluation 11/04/19    Authorization Type BCBS    Authorization - Visit Number 1   pt has used 14/30 visits   Authorization - Number of Visits 16    PT Start Time 1016    PT Stop Time 1101    PT Time Calculation (min) 45 min    Activity Tolerance Patient tolerated treatment well           Past Medical History:  Diagnosis Date  . AMA (advanced maternal age) multigravida 35+   . Anemia    hx  . H/O varicella   . Hx of pyelonephritis     Past Surgical History:  Procedure Laterality Date  . NO PAST SURGERIES      There were no vitals filed for this visit.    Subjective Assessment - 09/05/19 1021    Subjective Pt states she had shingles in Jan. 2020 - says Dr. Suszanne Conners thinks shingles possibly damaged the Lt vestibular nerve; states the dizziness has been occurring for approx. past year; saw Dr. Suszanne Conners and they did testing - diagnosed with hypofunction; pt states she has trouble with looking down and then turning head to either side (Lt side worse than Rt)    Pertinent History shingles Jan. 2020, chronic migraine    Diagnostic tests MRI of brain (-) and cervical MRI done in Oct. 2020 (revealed inflammation of facet joints at C2- C3)    Patient Stated Goals reduce dizziness, improve balance and neck pain    Currently in Pain? 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 Su Teoh    Onset Date/Surgical Date --   approx. May 2020     Precautions   Precautions None      Balance Screen   Has the patient fallen in the past 6 months No    Has the patient had a decrease in activity level because of a fear of falling?  No    Is the patient reluctant to leave their home because of a fear of falling?  No      Prior Function   Level of Independence Independent      Observation/Other Assessments   Focus on Therapeutic Outcomes (FOTO)  was not captured by front office on day of initial eval      Ambulation/Gait  Ambulation/Gait Yes    Ambulation/Gait Assistance 6: Modified independent (Device/Increase time)    Ambulation Distance (Feet) 125 Feet    Assistive device None    Gait Pattern Within Functional Limits    Ambulation Surface Level;Indoor                  Vestibular Assessment - 09/05/19 0001      Symptom Behavior   Type of Dizziness  Imbalance;Unsteady with head/body turns    Frequency of Dizziness depends on the movement    Duration of Dizziness few minutes    Symptom Nature Motion provoked    Aggravating Factors Turning head quickly;Forward bending;Comment   watching fast movement    Relieving Factors Closing eyes    Progression of Symptoms Better      Oculomotor Exam   Oculomotor Alignment Normal    Spontaneous Absent    Head shaking Horizontal Absent    Head Shaking Vertical Absent    Smooth Pursuits Intact   some nystagmus on 1st rep - decr. on reps 3 and 4   Saccades Intact      Oculomotor Exam-Fixation Suppressed    Left Head Impulse negative    Right Head Impulse negative         Visual Acuity   Static line 10    Dynamic line 7 - c/o moderate dizziness upon completion of test              Objective measurements completed on examination: See above findings.               PT Education - 09/05/19 2115    Education Details pt instructed in x1 viewing for HEP    Person(s) Educated Patient    Methods Explanation;Demonstration;Handout    Comprehension Verbalized understanding;Returned demonstration            PT Short Term Goals - 09/05/19 2151      PT SHORT TERM GOAL #1   Title Perform SOT and establish goal as appropriate.    Time 4    Period Weeks    Status New    Target Date 10/07/19      PT SHORT TERM GOAL #2   Title Improve DHI to </= 2 line difference for improved gaze stabilization.    Baseline initially 4 line difference - pt able to achieve a 3 line difference    Time 4    Period Weeks    Status New    Target Date 10/07/19      PT SHORT TERM GOAL #3   Title Independent in HEP for balance and vestibular exercises and cervical ROM/stretching.    Time 4    Period Weeks    Status New    Target Date 10/07/19      PT SHORT TERM GOAL #4   Title Pt will report decreased Lt cervical pain to </= 3/10 intensity for increased ease with head turns for work activities, driving, etc.    Baseline 4/10 intensity on 09-05-19    Time 4    Period Weeks    Status New    Target Date 10/07/19             PT Long Term Goals - 09/05/19 2155      PT LONG TERM GOAL #1   Title Improve SOT composite score by at least 10 points to demo improved balance.    Time 8    Period Weeks    Status New  Target Date 11/04/19      PT LONG TERM GOAL #2   Title Improve DVA to </= 2 line difference for improved gaze stabilization.    Baseline 4-3 line difference on 09-05-19    Time 8    Period Weeks    Status New    Target Date 11/04/19      PT LONG TERM GOAL #3   Title Pt will report reduced Lt cervical pain to </= 2/10 (at least 50%  improvement) for increased ease & comfort with head turns.    Baseline 4/10 on 09-05-19    Time 8    Period Weeks    Status New    Target Date 11/04/19      PT LONG TERM GOAL #4   Title Improve DHI score by at least 20 points for improved quality of life with decr. dizziness.    Baseline TBA as FOTO not captured by front office at initial eval - 09-05-19    Time 8    Period Weeks    Status New    Target Date 11/04/19      PT LONG TERM GOAL #5   Title Independent in updated HEP for vestibular and cervical exercises.    Time 8    Period Weeks    Status New    Target Date 11/04/19                  Plan - 09/05/19 2120    Clinical Impression Statement Pt is a 44 yr old lady with c/o dizziness which started approx. 4-5 months after she had shingles in Jan. 2020.  Pt presents with c/o dizziness with quick head turns and has decreased VOR function with DVA 3 line difference from SVA.  Pt has difficulty performing Romberg with EC and is unable to perform Sharpened Romberg with EC.  Pt also c/o Lt sided cervical pain with c/o tenderness with palpation of Lt lateral neck, in SCM muscle.  Pt's symptoms consistent with vestibular hypofunction but no nystagmus was noted during eval.  Pt will benefit from skilled PT to address balance and vestibular deficits and Lt cervical pain.    Personal Factors and Comorbidities Comorbidity 2    Comorbidities h/o varicella (shingles in Jan. 2020), h/o migraines    Examination-Participation Restrictions Driving;Community Activity;Cleaning;Shop;Other   work - Loss adjuster, chartered Evolving/Moderate complexity    Clinical Decision Making Moderate    Rehab Potential Good    PT Frequency 1x / week    PT Duration 8 weeks    PT Treatment/Interventions ADLs/Self Care Home Management;Patient/family education;Therapeutic activities;Therapeutic exercise;Balance training;Neuromuscular re-education;Gait training;Vestibular;Dry needling      PT Next Visit Plan dry needling requested for 3 visits to decr. Lt cervical pain; do SOT - begin HEP as appropriate based on results    PT Home Exercise Plan x1 viewing issued on 09-05-19    Recommended Other Services dry needling    Consulted and Agree with Plan of Care Patient           Patient will benefit from skilled therapeutic intervention in order to improve the following deficits and impairments:  Decreased balance, Dizziness, Pain  Visit Diagnosis: Cervicalgia - Plan: PT plan of care cert/re-cert  Dizziness and giddiness - Plan: PT plan of care cert/re-cert  Unsteadiness on feet - Plan: PT plan of care cert/re-cert     Problem List Patient Active Problem List   Diagnosis Date Noted  . Insomnia 12/06/2018  .  Chronic migraine 09/23/2017  . Menorrhagia 12/16/2012    Kary Kos, PT 09/05/2019, 10:04 PM  [facility] [address] [place], [place], [address] Phone: [phone]   Fax:  [phone]  Name: Kayden Amend MRN: [id number] Date of Birth: 01-03-1976

## 2019-09-05 NOTE — Patient Instructions (Signed)
Gaze Stabilization: Tip Card  1.Target must remain in focus, not blurry, and appear stationary while head is in motion. 2.Perform exercises with small head movements (45 to either side of midline). 3.Increase speed of head motion so long as target is in focus. 4.If you wear eyeglasses, be sure you can see target through lens (therapist will give specific instructions for bifocal / progressive lenses). 5.These exercises may provoke dizziness or nausea. Work through these symptoms. If too dizzy, slow head movement slightly. Rest between each exercise. 6.Exercises demand concentration; avoid distractions. 7.For safety, perform standing exercises close to a counter, wall, corner, or next to someone.     Gaze Stabilization: Standing Feet Apart    Feet shoulder width apart, keeping eyes on target on wall _6___ feet away, tilt head down 15-30 and move head side to side for __60__ seconds. Repeat while moving head up and down for _60___ seconds. Do _3-5___ sessions per day. Repeat using target on pattern background.    

## 2019-09-20 ENCOUNTER — Ambulatory Visit: Payer: BC Managed Care – PPO | Admitting: Physical Therapy

## 2019-09-20 ENCOUNTER — Other Ambulatory Visit: Payer: Self-pay

## 2019-09-20 DIAGNOSIS — R2681 Unsteadiness on feet: Secondary | ICD-10-CM

## 2019-09-20 DIAGNOSIS — M542 Cervicalgia: Secondary | ICD-10-CM | POA: Diagnosis not present

## 2019-09-20 DIAGNOSIS — R42 Dizziness and giddiness: Secondary | ICD-10-CM

## 2019-09-21 ENCOUNTER — Encounter: Payer: Self-pay | Admitting: Physical Therapy

## 2019-09-21 NOTE — Patient Instructions (Signed)
4 positions - EO and EC - feet apart and together on pillows in corner

## 2019-09-21 NOTE — Therapy (Signed)
[facility] [address] [place], [place], [address] Phone: [phone]   Fax:  [phone]  Physical Therapy Treatment  Patient Details  Name: Ashley Lane MRN: [id number] Date of Birth: 1975-09-05 Referring Provider (PT): Dt. Newman Pies   Encounter Date: 09/20/2019   PT End of Session - 09/21/19 1512    Visit Number 2    Number of Visits 9    Date for PT Re-Evaluation 11/04/19    Authorization Type BCBS    Authorization - Visit Number 1   pt has used 14/30 visits   Authorization - Number of Visits 16    PT Start Time 1235    PT Stop Time 1317    PT Time Calculation (min) 42 min    Activity Tolerance Patient tolerated treatment well    Behavior During Therapy [facility] for tasks assessed/performed           Past Medical History:  Diagnosis Date  . AMA (advanced maternal age) multigravida 35+   . Anemia    hx  . H/O varicella   . Hx of pyelonephritis     Past Surgical History:  Procedure Laterality Date  . NO PAST SURGERIES      There were no vitals filed for this visit.   Subjective Assessment - 09/21/19 1509    Subjective Pt states she thinks she is a little better; did get dizzy when playing with her daughter in the pool over the weekend - was swinging her from side to side in the water and got dizzy    Pertinent History shingles Jan. 2020, chronic migraine    Diagnostic tests MRI of brain (-) and cervical MRI done in Oct. 2020 (revealed inflammation of facet joints at C2- C3)    Patient Stated Goals reduce dizziness, improve balance and neck pain    Currently in Pain? 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 composite score with N= 70/100  Somatosensory, visual and vestibular inputs are all WNL's  Condition 1 - all 3 trials WNL's Condition 2 - trials 1 & 2 WNL's:  Trial 3 slightly decreased Condition 3 - trial 1 below N;   Trials 2 and 3 WNL's Condition 4 - all 3 trials WNL's Condition 5 - trial below N slightly; trials 2 & 3 WNL's Condition 6 - trials 1 & 3 WNL's; trial 2 below N  Pt instructed in balance on foam with EO and EC - feet apart and together with head turns for HEP    Explained results of SOT to pt - she verbalized understanding                 Balance Exercises - 09/21/19 0001      Balance Exercises: Standing   Standing Eyes Opened Narrow base of support (BOS);Wide (BOA);Head turns;Foam/compliant surface;5 reps    Standing Eyes Closed Narrow base of support (BOS);Wide (BOA);Head turns;Foam/compliant surface;5 reps             PT Education - 09/21/19 1511    Education Details added balance on foam - 4 positions for incr. vestibular input in balance    Person(s) Educated Patient    Methods Explanation;Demonstration;Handout    Comprehension Verbalized understanding;Returned demonstration            PT Short Term Goals - 09/21/19 1517  PT SHORT TERM GOAL #1   Title Perform SOT and establish goal as appropriate.    Baseline 09-20-19 - SOT scores as well as somatosensory, visual and vestibular inputs are all WNL's    Time 4    Period Weeks    Status Deferred    Target Date 10/07/19      PT SHORT TERM GOAL #2   Title Improve DHI to </= 2 line difference for improved gaze stabilization.    Baseline initially 4 line difference - pt able to achieve a 3 line difference    Time 4    Period Weeks    Status New    Target Date 10/07/19      PT SHORT TERM GOAL #3   Title Independent in HEP for balance and vestibular exercises and cervical ROM/stretching.    Time 4    Period Weeks    Status New    Target Date 10/07/19      PT SHORT TERM GOAL #4   Title Pt will report decreased Lt cervical pain to </= 3/10 intensity for increased ease with head turns for work activities, driving, etc.    Baseline 4/10 intensity on 09-05-19    Time 4    Period Weeks    Status New     Target Date 10/07/19             PT Long Term Goals - 09/21/19 1518      PT LONG TERM GOAL #1   Title Improve SOT composite score by at least 10 points to demo improved balance.    Time 8    Period Weeks    Status New      PT LONG TERM GOAL #2   Title Improve DVA to </= 2 line difference for improved gaze stabilization.    Baseline 4-3 line difference on 09-05-19    Time 8    Period Weeks    Status New      PT LONG TERM GOAL #3   Title Pt will report reduced Lt cervical pain to </= 2/10 (at least 50% improvement) for increased ease & comfort with head turns.    Baseline 4/10 on 09-05-19    Time 8    Period Weeks    Status New      PT LONG TERM GOAL #4   Title Improve DHI score by at least 20 points for improved quality of life with decr. dizziness.    Baseline TBA as FOTO not captured by front office at initial eval - 09-05-19    Time 8    Period Weeks    Status New      PT LONG TERM GOAL #5   Title Independent in updated HEP for vestibular and cervical exercises.    Time 8    Period Weeks    Status New                 Plan - 09/21/19 1515    Clinical Impression Statement Pt's SOT composite score is WNL's as well as somatosensory, visual and vestibular inputs; pt's score 71/100 with N= 70/100.  Pt did report some mild increase in dizziness when getting off Balance Master after SOT completed.    Personal Factors and Comorbidities Comorbidity 2    Comorbidities h/o varicella (shingles in Jan. 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 Lt cervical pain; check  balance on foam exs given for HEP - x1 viewing - go to pattern??    PT Home Exercise Plan x1 viewing issued on 09-05-19    Consulted and Agree with Plan of Care Patient           Patient will benefit from skilled therapeutic intervention in order to improve the following deficits and impairments:  Decreased balance, Dizziness, Pain  Visit Diagnosis: Unsteadiness on feet  Dizziness and giddiness     Problem List Patient Active Problem List   Diagnosis Date Noted  . Insomnia 12/06/2018  . Chronic migraine 09/23/2017  . Menorrhagia 12/16/2012    Kary Kos, PT 09/21/2019, 3:19 PM  [facility] [address] [place], [place], [address] Phone: [phone]   Fax:  [phone]  Name: Ashley Lane MRN: [id number] Date of Birth: Jul 03, 1975

## 2019-09-27 ENCOUNTER — Ambulatory Visit: Payer: BC Managed Care – PPO | Admitting: Physical Therapy

## 2019-10-06 ENCOUNTER — Ambulatory Visit: Payer: BC Managed Care – PPO | Attending: Family Medicine | Admitting: Physical Therapy

## 2019-10-06 ENCOUNTER — Other Ambulatory Visit: Payer: Self-pay

## 2019-10-06 ENCOUNTER — Encounter: Payer: Self-pay | Admitting: Physical Therapy

## 2019-10-06 DIAGNOSIS — M542 Cervicalgia: Secondary | ICD-10-CM | POA: Diagnosis present

## 2019-10-06 NOTE — Patient Instructions (Addendum)
Trigger Point Dry Needling  . What is Trigger Point Dry Needling (DN)? o DN is a physical therapy technique used to treat muscle pain and dysfunction. Specifically, DN helps deactivate muscle trigger points (muscle knots).  o A thin filiform needle is used to penetrate the skin and stimulate the underlying trigger point. The goal is for a local twitch response (LTR) to occur and for the trigger point to relax. No medication of any kind is injected during the procedure.   . What Does Trigger Point Dry Needling Feel Like?  o The procedure feels different for each individual patient. Some patients report that they do not actually feel the needle enter the skin and overall the process is not painful. Very mild bleeding may occur. However, many patients feel a deep cramping in the muscle in which the needle was inserted. This is the local twitch response.   Marland Kitchen How Will I feel after the treatment? o Soreness is normal, and the onset of soreness may not occur for a few hours. Typically this soreness does not last longer than two days.  o Bruising is uncommon, however; ice can be used to decrease any possible bruising.  o In rare cases feeling tired or nauseous after the treatment is normal. In addition, your symptoms may get worse before they get better, this period will typically not last longer than 24 hours.   . What Can I do After My Treatment? o Increase your hydration by drinking more water for the next 24 hours. o You may place ice or heat on the areas treated that have become sore, however, do not use heat on inflamed or bruised areas. Heat often brings more relief post needling. o You can continue your regular activities, but vigorous activity is not recommended initially after the treatment for 24 hours. o DN is best combined with other physical therapy such as strengthening, stretching, and other therapies   Flexibility: Upper Trapezius Stretch    To stretch Left shoulder: tilt chin down  and tilt right ear over towards right shoulder until you feel a stretch on left side. Hold __30__ seconds. Repeat __2__ times per set.    Shoulder Circle    Move shoulders up and around in a circle, forward and then backward. Repeat __10__ times. Do _2___ sessions per day.

## 2019-10-06 NOTE — Therapy (Signed)
[facility] [address] [place], [place], [address] Phone: [phone]   Fax:  [phone]  Physical Therapy Treatment  Patient Details  Name: Ashley Lane MRN: [id number] Date of Birth: [birth year] Referring Provider (PT): Dt. Newman Pies   Encounter Date: 10/06/2019   PT End of Session - 10/06/19 2046    Visit Number 3    Number of Visits 9    Date for PT Re-Evaluation 11/04/19    Authorization Type BCBS    Authorization - Visit Number 3   pt has used 14/30 visits   Authorization - Number of Visits 14    PT Start Time 1105    PT Stop Time 1149    PT Time Calculation (min) 44 min    Activity Tolerance Patient tolerated treatment well    Behavior During Therapy [facility] for tasks assessed/performed           Past Medical History:  Diagnosis Date  . AMA (advanced maternal age) multigravida 35+   . Anemia    hx  . H/O varicella   . Hx of pyelonephritis     Past Surgical History:  Procedure Laterality Date  . NO PAST SURGERIES      There were no vitals filed for this visit.   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  No headache.  Is here for dry needling today.    Pertinent History shingles Jan. 2020, chronic migraine    Diagnostic tests MRI of brain (-) and cervical MRI done in Oct. 2020 (revealed inflammation of facet joints at C2- C3)    Patient Stated Goals reduce dizziness, improve balance and neck pain    Currently in Pain? Yes    Pain Score 5     Pain Location Neck    Pain Orientation Left    Pain Descriptors / Indicators Discomfort;Constant    Pain Type Chronic pain    Pain Onset More than a month ago                             [facility] Adult PT Treatment/Exercise - 10/06/19 1644      Therapeutic Activites    Therapeutic Activities Other Therapeutic Activities      Other Therapeutic Activities Provided pt with dry needling educational handout and reviewed all information about dry needling mechanism, typical side effects and management of side effects.  Reviewed contraindications with none identified.        Exercises   Exercises Shoulder;Neck      Shoulder Exercises: Seated   Other Seated Exercises Shoulder rolls posteriorly x 10 reps after TDN      Neck Exercises: Stretches   Upper Trapezius Stretch Left;2 reps;30 seconds    Upper Trapezius Stretch Limitations after TDN            Trigger Point Dry Needling - 10/06/19 2041    Consent Given? Yes    Education Handout Provided Yes    Muscles Treated Head and Neck Upper trapezius    Dry Needling Comments Performed in supine to L side only.  Pain stayed localized, no referral.  Pt reported reduction in pain/tenderness to palpation in L SCM after TDN of upper trap; did not have to perform needling of SCM today    Upper Trapezius Response Twitch reponse elicited;Palpable increased muscle  length                PT Education - 10/06/19 2046    Education Details see TA; stretch for upper trap    Person(s) Educated Patient    Methods Explanation;Demonstration    Comprehension Verbalized understanding;Returned demonstration          Trigger Point Dry Needling  . What is Trigger Point Dry Needling (DN)? o DN is a physical therapy technique used to treat muscle pain and dysfunction. Specifically, DN helps deactivate muscle trigger points (muscle knots).  o A thin filiform needle is used to penetrate the skin and stimulate the underlying trigger point. The goal is for a local twitch response (LTR) to occur and for the trigger point to relax. No medication of any kind is injected during the procedure.   . What Does Trigger Point Dry Needling Feel Like?  o The procedure feels different for each individual patient. Some patients report that they do not actually feel the needle enter the skin  and overall the process is not painful. Very mild bleeding may occur. However, many patients feel a deep cramping in the muscle in which the needle was inserted. This is the local twitch response.   Marland Kitchen. How Will I feel after the treatment? o Soreness is normal, and the onset of soreness may not occur for a few hours. Typically this soreness does not last longer than two days.  o Bruising is uncommon, however; ice can be used to decrease any possible bruising.  o In rare cases feeling tired or nauseous after the treatment is normal. In addition, your symptoms may get worse before they get better, this period will typically not last longer than 24 hours.   . What Can I do After My Treatment? o Increase your hydration by drinking more water for the next 24 hours. o You may place ice or heat on the areas treated that have become sore, however, do not use heat on inflamed or bruised areas. Heat often brings more relief post needling. o You can continue your regular activities, but vigorous activity is not recommended initially after the treatment for 24 hours. o DN is best combined with other physical therapy such as strengthening, stretching, and other therapies   Flexibility: Upper Trapezius Stretch    To stretch Left shoulder: tilt chin down and tilt right ear over towards right shoulder until you feel a stretch on left side. Hold __30__ seconds. Repeat __2__ times per set.    Shoulder Circle    Move shoulders up and around in a circle, forward and then backward. Repeat __10__ times. Do _2___ sessions per day.     PT Short Term Goals - 09/21/19 1517      PT SHORT TERM GOAL #1   Title Perform SOT and establish goal as appropriate.    Baseline 09-20-19 - SOT scores as well as somatosensory, visual and vestibular inputs are all WNL's    Time 4    Period Weeks    Status Deferred    Target Date 10/07/19      PT SHORT TERM GOAL #2   Title Improve DHI to </= 2 line difference for  improved gaze stabilization.    Baseline initially 4 line difference - pt able to achieve a 3 line difference    Time 4    Period Weeks    Status New    Target Date 10/07/19      PT SHORT TERM GOAL #3  Title Independent in HEP for balance and vestibular exercises and cervical ROM/stretching.    Time 4    Period Weeks    Status New    Target Date 10/07/19      PT SHORT TERM GOAL #4   Title Pt will report decreased Lt cervical pain to </= 3/10 intensity for increased ease with head turns for work activities, driving, etc.    Baseline 4/10 intensity on 09-05-19    Time 4    Period Weeks    Status New    Target Date 10/07/19             PT Long Term Goals - 09/21/19 1518      PT LONG TERM GOAL #1   Title Improve SOT composite score by at least 10 points to demo improved balance.    Time 8    Period Weeks    Status New      PT LONG TERM GOAL #2   Title Improve DVA to </= 2 line difference for improved gaze stabilization.    Baseline 4-3 line difference on 09-05-19    Time 8    Period Weeks    Status New      PT LONG TERM GOAL #3   Title Pt will report reduced Lt cervical pain to </= 2/10 (at least 50% improvement) for increased ease & comfort with head turns.    Baseline 4/10 on 09-05-19    Time 8    Period Weeks    Status New      PT LONG TERM GOAL #4   Title Improve DHI score by at least 20 points for improved quality of life with decr. dizziness.    Baseline TBA as FOTO not captured by front office at initial eval - 09-05-19    Time 8    Period Weeks    Status New      PT LONG TERM GOAL #5   Title Independent in updated HEP for vestibular and cervical exercises.    Time 8    Period Weeks    Status New                 Plan - 10/06/19 2048    Clinical Impression Statement Performed trigger point dry needling of L upper trapezius to address ongoing, chronic neck pain/tension and decreased ROM to allow pt to more fully participate in and tolerate  vestibular exercises.  No reports of dizziness when performing dry needling.  Will assess patient's response to dry needling and will continue to address in order to progress towards LTG.    Personal Factors and Comorbidities Comorbidity 2    Comorbidities h/o varicella (shingles in Jan. 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  dry needling of L upper trap and SCM for Lt cervical pain;  Check STG; check balance on foam exs given for HEP - x1 viewing - go to pattern??    PT Home Exercise Plan x1 viewing issued on 09-05-19    Consulted and Agree with Plan of Care Patient           Patient will  benefit from skilled therapeutic intervention in order to improve the following deficits and impairments:  Decreased balance, Dizziness, Pain  Visit Diagnosis: Cervicalgia     Problem List Patient Active Problem List   Diagnosis Date Noted  . Insomnia 12/06/2018  . Chronic migraine 09/23/2017  . Menorrhagia 12/16/2012    Dierdre Highman, PT, DPT 10/06/19    9:05 PM    [facility] [address] [place], [place], [address] Phone: [phone]   Fax:  [phone]  Name: Beola Vasallo MRN: [id number] Date of Birth: 03-20-1976

## 2019-10-18 ENCOUNTER — Ambulatory Visit: Payer: BC Managed Care – PPO | Admitting: Physical Therapy

## 2019-10-26 ENCOUNTER — Ambulatory Visit: Payer: BC Managed Care – PPO | Attending: Family Medicine | Admitting: Physical Therapy

## 2019-10-26 DIAGNOSIS — R2681 Unsteadiness on feet: Secondary | ICD-10-CM | POA: Insufficient documentation

## 2019-10-26 DIAGNOSIS — R42 Dizziness and giddiness: Secondary | ICD-10-CM | POA: Insufficient documentation

## 2019-10-26 DIAGNOSIS — M542 Cervicalgia: Secondary | ICD-10-CM | POA: Insufficient documentation

## 2019-11-01 ENCOUNTER — Ambulatory Visit: Payer: BC Managed Care – PPO | Admitting: Physical Therapy

## 2019-11-09 ENCOUNTER — Encounter: Payer: Self-pay | Admitting: Physical Therapy

## 2019-11-09 ENCOUNTER — Ambulatory Visit: Payer: BC Managed Care – PPO | Admitting: Physical Therapy

## 2019-11-09 ENCOUNTER — Other Ambulatory Visit: Payer: Self-pay

## 2019-11-09 DIAGNOSIS — M542 Cervicalgia: Secondary | ICD-10-CM | POA: Diagnosis not present

## 2019-11-09 DIAGNOSIS — R2681 Unsteadiness on feet: Secondary | ICD-10-CM | POA: Diagnosis present

## 2019-11-09 DIAGNOSIS — R42 Dizziness and giddiness: Secondary | ICD-10-CM | POA: Diagnosis present

## 2019-11-09 NOTE — Therapy (Addendum)
[facility] [address] [place], [place], [address] Phone: [phone]   Fax:  [phone]  Physical Therapy Treatment  Patient Details  Name: Ashley Lane MRN: [id number] Date of Birth: [birth year] Referring Provider (PT): Dt. Leta Baptist   Encounter Date: 11/09/2019   PT End of Session - 11/09/19 1222    Visit Number 4    Number of Visits 9    Date for PT Re-Evaluation 11/18/19   extended a week due to missing multiple weeks of therapy visits   Authorization Type [employer]    Authorization - Visit Number 4   pt has used 14/30 visits   Authorization - Number of Visits 14    PT Start Time 1025    PT Stop Time 1105    PT Time Calculation (min) 40 min    Activity Tolerance Patient tolerated treatment well    Behavior During Therapy [facility] for tasks assessed/performed           Past Medical History:  Diagnosis Date  . AMA (advanced maternal age) multigravida 14+   . Anemia    hx  . H/O varicella   . Hx of pyelonephritis     Past Surgical History:  Procedure Laterality Date  . NO PAST SURGERIES      There were no vitals filed for this visit.   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  No dizziness or headaches    Pertinent History shingles Jan. 2020, chronic migraine    Diagnostic tests MRI of brain (-) and cervical MRI done in Oct. 2020 (revealed inflammation of facet joints at C2- C3)    Patient Stated Goals reduce dizziness, improve balance and neck pain    Currently in Pain? No/denies    Pain Onset More than a month ago              [facility] PT Assessment - 11/09/19 1030      Assessment   Medical Diagnosis Vertigo    Referring Provider (PT) Dt. Su Teoh    Onset Date/Surgical Date --   May 2020     Precautions   Precautions None      Prior Function   Level of Independence Independent       Observation/Other Assessments   Focus on Therapeutic Outcomes (FOTO)  was not captured by front office on day of initial eval      ROM / Strength   AROM / PROM / Strength AROM      AROM   Overall AROM  Deficits    Overall AROM Comments pain on L side, mild pain/tension    AROM Assessment Site Cervical    Cervical Flexion 60    Cervical Extension 45    Cervical - Right Side Bend 35    Cervical - Left Side Bend 40    Cervical - Right Rotation 75    Cervical - Left Rotation 70               Vestibular Assessment - 11/09/19 1035      Visual Acuity   Static 11    Dynamic 9   2 line difference, no dizziness     Set designer Comment composite score increased to 78           Conditions: 1: 3 trials WNL 2: 3  trials WNL  3:  3 trials WNL 4: 3 trials WNL 5: 3 trials WNL 6: 2 trials WNL, first trial below normal limits Composite score: 78 WNL Sensory Analysis Som: WNL Vis: WNL Vest: WNL Pref: WNL Strategy analysis: decreased use of hip/ankle strategy on conditions 5/6 COG alignment: Pt with COG midline and slightly posterior but still within BOS   PT Education - 11/09/19 1220    Education Details Recommending one more dry needling session and to review 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  Also discussed importance of taking breaks when at the computer - setting a timer and then taking a short walk, performing stretches or exercises.    Person(s) Educated Patient    Methods Explanation    Comprehension Verbalized understanding            PT Short Term Goals - 09/21/19 1517      PT SHORT TERM GOAL #1   Title Perform SOT and establish goal as appropriate.    Baseline 09-20-19 - SOT scores as well as  somatosensory, visual and vestibular inputs are all WNL's    Time 4    Period Weeks    Status Deferred    Target Date 10/07/19      PT SHORT TERM GOAL #2   Title Improve DHI to </= 2 line difference for improved gaze stabilization.    Baseline initially 4 line difference - pt able to achieve a 3 line difference    Time 4    Period Weeks    Status New    Target Date 10/07/19      PT SHORT TERM GOAL #3   Title Independent in HEP for balance and vestibular exercises and cervical ROM/stretching.    Time 4    Period Weeks    Status New    Target Date 10/07/19      PT SHORT TERM GOAL #4   Title Pt will report decreased Lt cervical pain to </= 3/10 intensity for increased ease with head turns for work activities, driving, etc.    Baseline 4/10 intensity on 09-05-19    Time 4    Period Weeks    Status New    Target Date 10/07/19             PT Long Term Goals - 11/09/19 1223      PT LONG TERM GOAL #1   Title Improve SOT composite score by at least 10 points to demo improved balance.    Baseline improved 8 points, still WNL, only one trial below normal limits    Time 8    Period Weeks    Status Achieved      PT LONG TERM GOAL #2   Title Improve DVA to </= 2 line difference for improved gaze stabilization.    Baseline 2 line difference    Time 8    Period Weeks    Status Achieved      PT LONG TERM GOAL #3   Title Pt will report reduced Lt cervical pain to </= 2/10 (at least 50% improvement) for increased ease & comfort with head turns.    Time 8    Period Weeks    Status Achieved      PT LONG TERM GOAL #4   Title Improve  DHI score by at least 20 points for improved quality of life with decr. dizziness.    Baseline TBA as FOTO not captured by front office at initial eval - 09-05-19    Time 8    Period Weeks    Status On-going      PT LONG TERM GOAL #5   Title Independent in updated HEP for vestibular and cervical exercises.    Time 8    Period Weeks    Status  On-going                 Plan - 11/09/19 1224    Clinical Impression Statement Performed reassessment to determine progress towards LTG.  Pt is making good progress and has met 3/5 LTG.  She reports resolution of dizziness and decreased neck pain overall.  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  Pt would benefit from one more therapy session to continue to address ongoing goals prior to D/C.    Personal Factors and Comorbidities Comorbidity 2    Comorbidities h/o varicella (shingles in Jan. 2020), h/o migraines    Examination-Participation Restrictions Driving;Community Activity;Cleaning;Shop;Other   work - Loss adjuster, chartered Evolving/Moderate complexity    Rehab Potential Good    PT Frequency 1x / week    PT Duration 8 weeks    PT Treatment/Interventions ADLs/Self Care Home Management;Patient/family education;Therapeutic activities;Therapeutic exercise;Balance training;Neuromuscular re-education;Gait training;Vestibular;Dry needling    PT Next Visit Plan dry needling of L upper trap and SCM for Lt cervical pain; DHI, final HEP, D/C    PT Home Exercise Plan x1 viewing issued on 09-05-19    Consulted and Agree with Plan of Care Patient           Patient will benefit from skilled therapeutic intervention in order to improve the following deficits and impairments:  Decreased balance, Dizziness, Pain  Visit Diagnosis: Cervicalgia  Unsteadiness on feet  Dizziness and giddiness     Problem List Patient Active Problem List   Diagnosis Date Noted  . Insomnia 12/06/2018  . Chronic migraine 09/23/2017  . Menorrhagia 12/16/2012    Rico Junker, PT, DPT 11/09/19    12:28 PM    [facility] [address] [place], [place], [address] Phone: [phone]   Fax:  [phone]  Name: Ashley Lane MRN:  [id number] Date of Birth: [birth year]

## 2019-11-15 ENCOUNTER — Encounter: Payer: BC Managed Care – PPO | Admitting: Physical Therapy

## 2020-06-29 IMAGING — CR CERVICAL SPINE - COMPLETE 4+ VIEW
5 series · 5 of 5 positions shown · non-contrast
Comparison: None.

CLINICAL DATA: Posterior neck pain with pain radiating down Lt arm
x 3 weeks, no injury, no surg

EXAM:
CERVICAL SPINE - COMPLETE 4+ VIEW

[w c-spine lat]
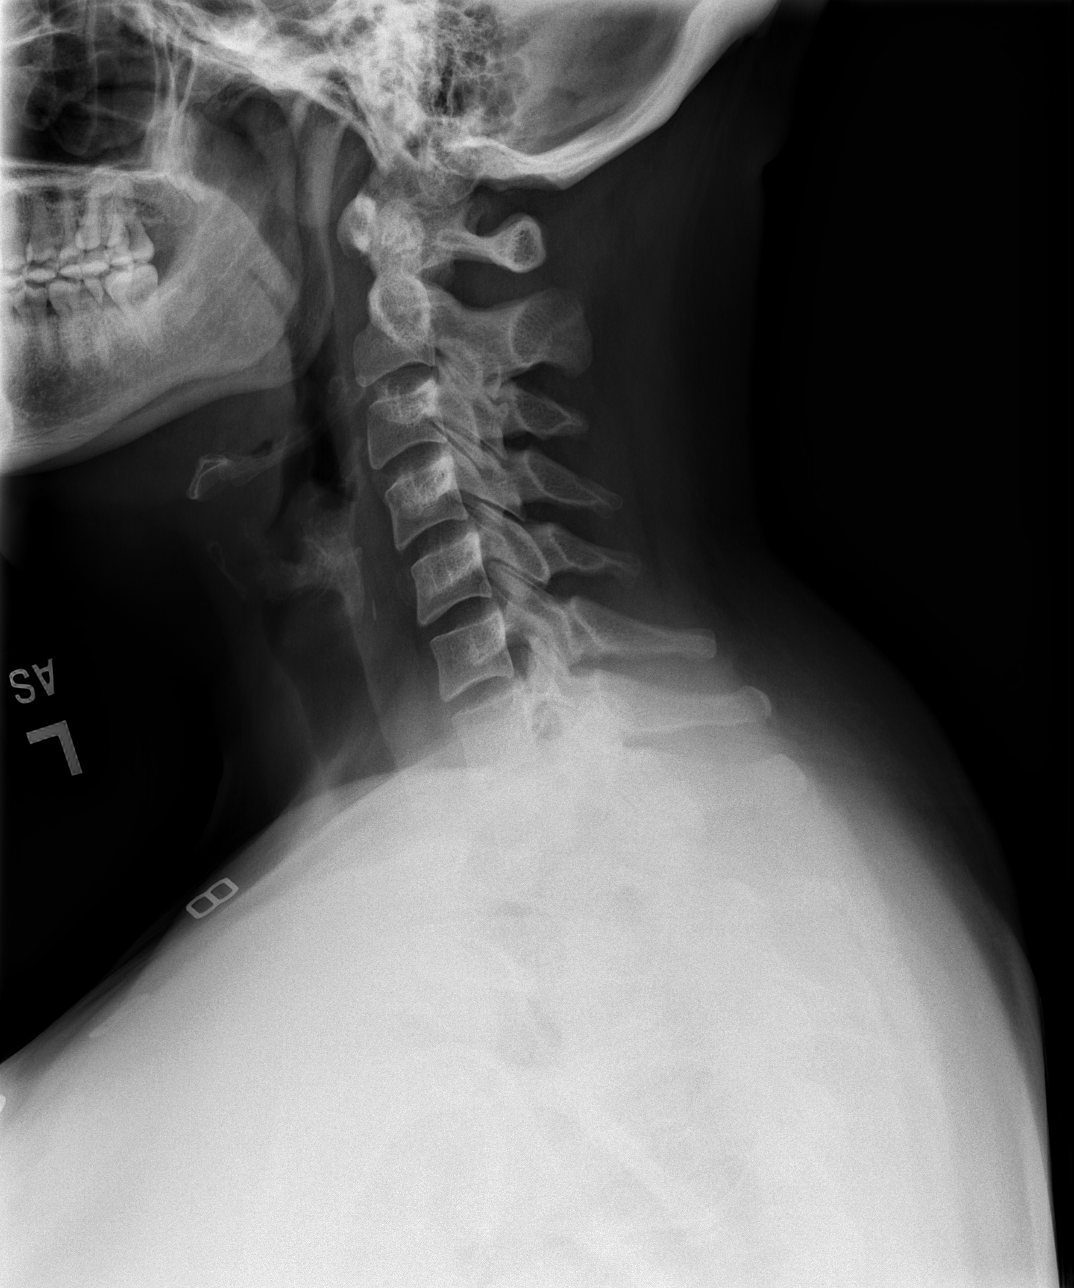

[w c-spine oblique (1 of 2)]
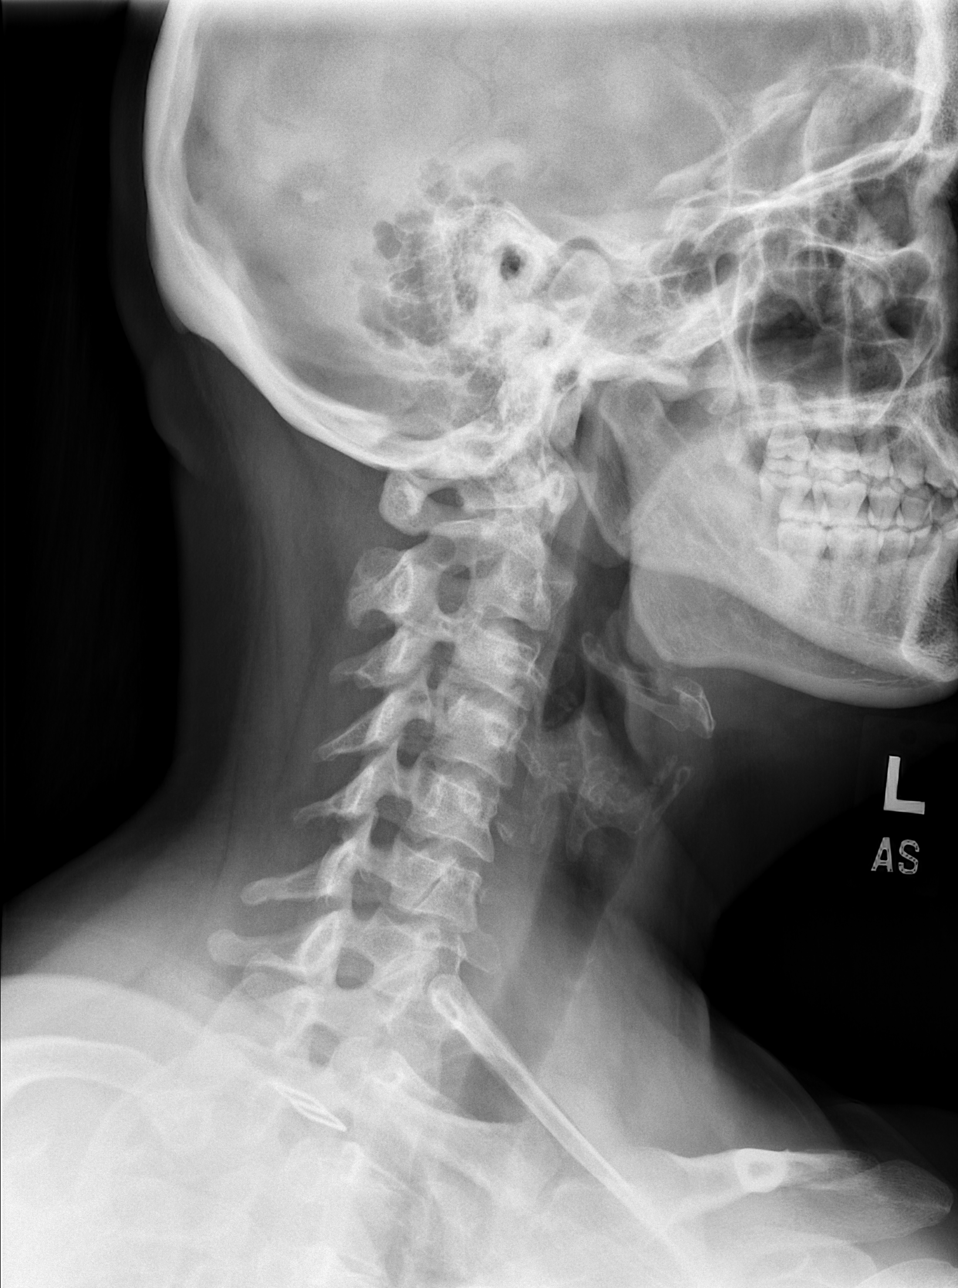

[w c-spine oblique (2 of 2)]
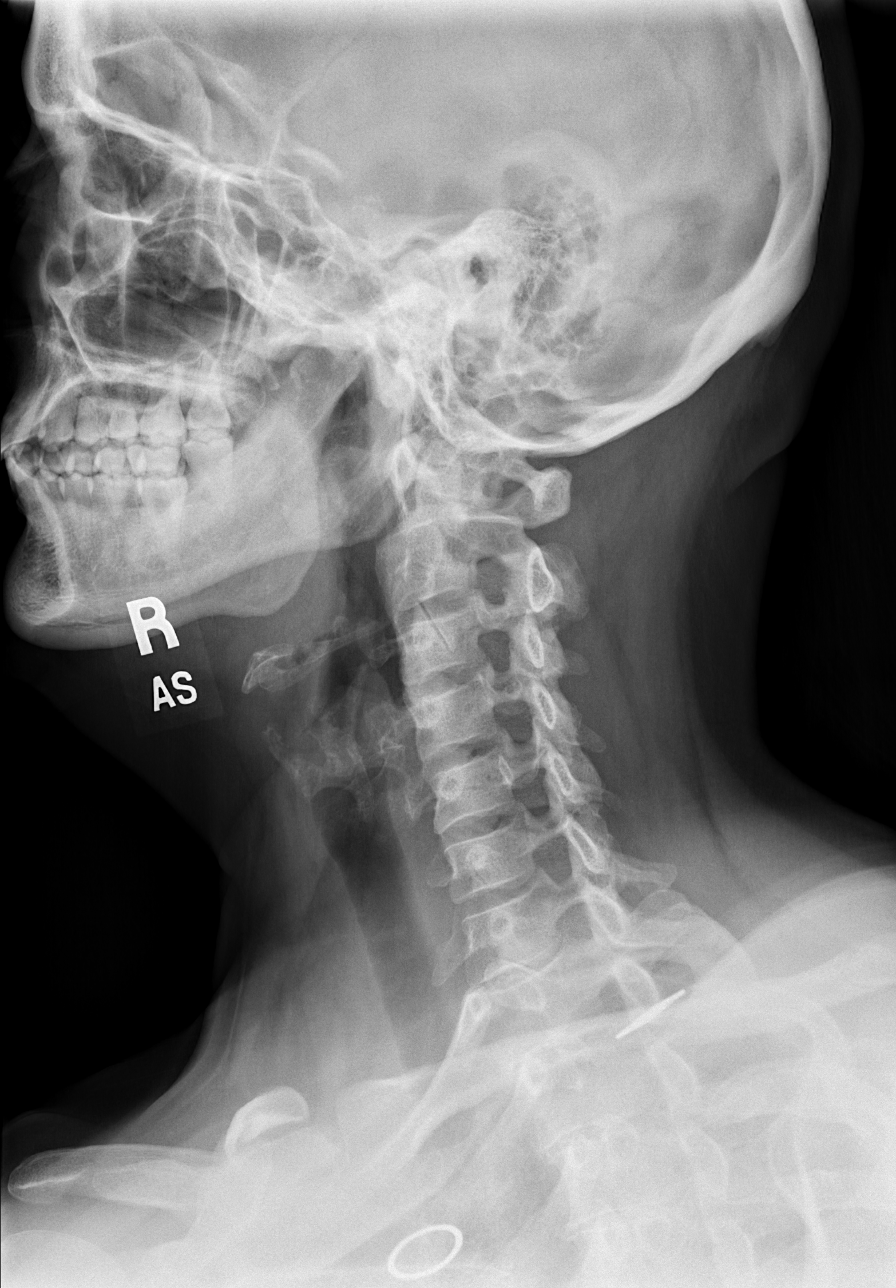

[w c-spine a.p. *]
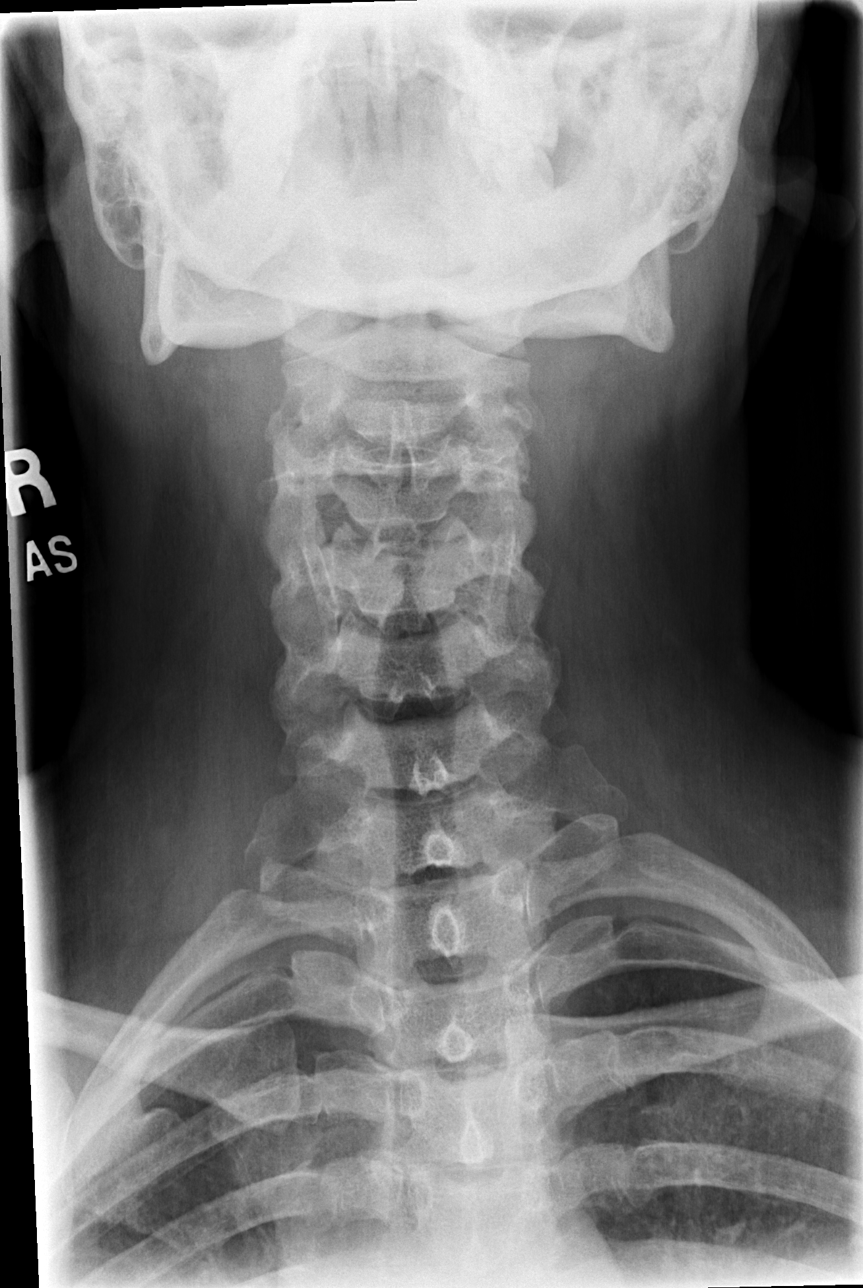

[w c-spine odontoid *]
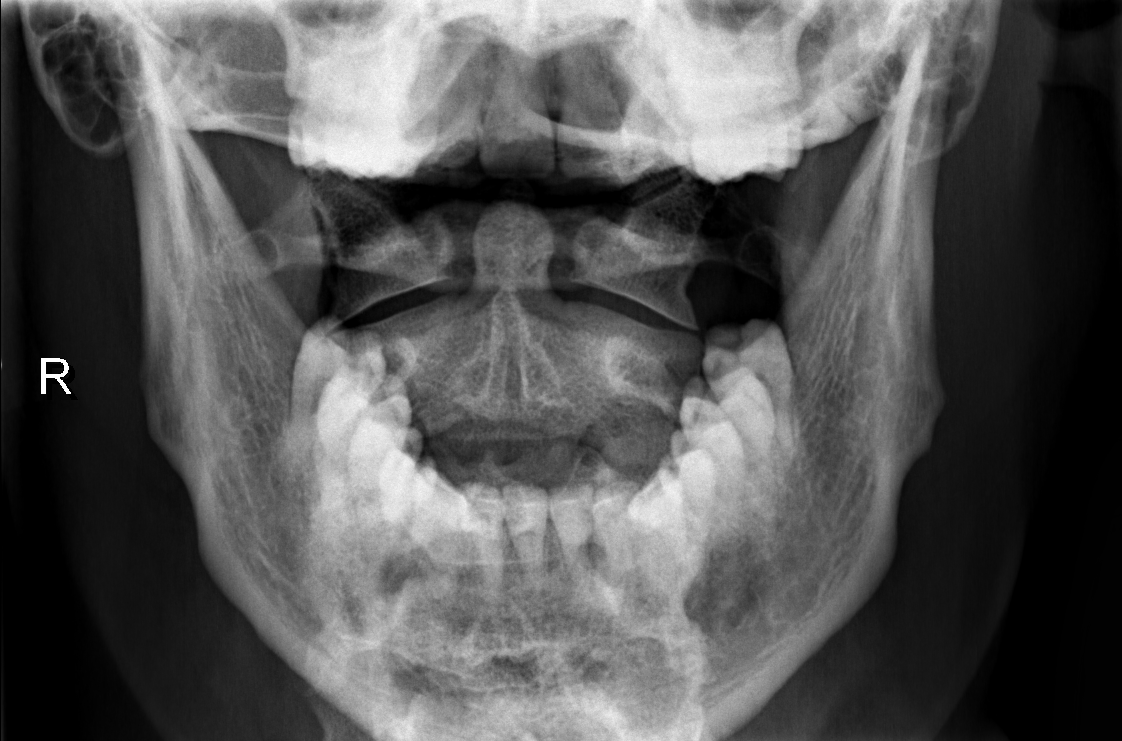

[5 of 5 positions shown; findings below may reference images not displayed]

FINDINGS: The lateral view images through the bottom of C7. Prevertebral soft
tissues are within normal limits. Maintenance of vertebral body
height and alignment. Facets are well-aligned. Lateral masses and
odontoid process within normal limits on open-mouth view. Left-sided
neural foraminal narrowing at C3-4. This is secondary to facet
arthropathy. Intervertebral disc heights are maintained.
IMPRESSION: No acute osseous abnormality.

Left-sided neural foraminal narrowing at C3-4 secondary to early
spondylosis.

## 2020-08-10 NOTE — Progress Notes (Signed)
45 y.o. G88P3003 Married White or Caucasian female here for annual exam.  C/o: frequency of urination and nocturia noticed x 1.5 weeks. Denies dysuria.  In 2020 had some kind of viral illness and had long term symptoms and was sick most of the year, now seems so much better.  Periods are regular, periods are heavy days 2 and 3 with cramps with heavy bleeding. Was working with PCP with anemia, last check was normal. Sometimes has dizzy spells with period. Last [place] in 2020. Period Cycle (Days): 28 Period Duration (Days): 4-5 Period Pattern: Regular Menstrual Flow: Heavy,Moderate Menstrual Control: Maxi pad,Tampon Dysmenorrhea: (!) Moderate Dysmenorrhea Symptoms: Cramping Last LMP 07/20/20.          Sexually active: Yes.  The current method of family planning is none. Patient's husband with vasectomy.   Exercising: Yes. Bike/walking  Smoker:  No.  Health Maintenance: Pap:  04/09/2017  Normal,  HR HPV Neg.  History of abnormal Pap: No. MMG:  06/13/19 Density B, BiRads 1 Neg. Hep C testing: No Screening Labs: with PCP.   reports that she has never smoked. She has never used smokeless tobacco. She reports that she does not drink alcohol and does not use drugs.  Past Medical History:  Diagnosis Date  . AMA (advanced maternal age) multigravida 35+   . Anemia    hx  . H/O varicella   . Hx of pyelonephritis   /  Past Surgical History:  Procedure Laterality Date  . NO PAST SURGERIES      No current outpatient medications on file.   No current facility-administered medications for this visit.    Family History  Problem Relation Age of Onset  . Hypertension Mother   . Thyroid disease Mother   . Cancer Sister        cervical  . Hypertension Maternal Grandmother   . Thyroid disease Maternal Grandmother   . Hypertension Maternal Grandfather   . Other Father        died in car accident    Review of Systems  Constitutional: Negative.   HENT: Negative.   Eyes: Negative.    Respiratory: Negative.   Cardiovascular: Negative.   Gastrointestinal: Negative.   Endocrine: Negative.   Genitourinary: Positive for frequency.  Musculoskeletal: Negative.   Skin: Negative.   Allergic/Immunologic: Negative.   Neurological: Negative.   Hematological: Negative.   Psychiatric/Behavioral: Negative.     Exam:   Ht 5' 5.5" (1.664 m)   Wt 236 lb (107 kg)   LMP 07/20/2020   BMI 38.68 kg/m   Height: 5' 5.5" (166.4 cm)  General appearance: alert, cooperative and appears stated age, no acute distress Head: Normocephalic, without obvious abnormality Neck: no adenopathy, thyroid normal to inspection and palpation Lungs: clear to auscultation bilaterally Breasts: Normal to palpation without dominant masses Heart: regular rate and rhythm Abdomen: soft, non-tender; no masses,  no organomegaly Extremities: extremities normal, no edema Skin: No rashes or lesions Lymph nodes: Cervical, supraclavicular, and axillary nodes normal. No abnormal inguinal nodes palpated Neurologic: Grossly normal   Pelvic: External genitalia:  no lesions              Urethra:  normal appearing urethra with no masses, tenderness or lesions              Bartholins and Skenes: normal                 Vagina: normal appearing vagina, appropriate for age, normal appearing discharge, no lesions  Cervix: neg cervical motion tenderness, no visible lesions             Bimanual Exam:   Uterus:  minimally enlarged, boggy, mildly tender              Adnexa: no palpable mass, mildly tender                 A:  Well woman exam  Frequency of urination - Plan: Urinalysis,Complete w/RFL Culture, Urine Culture, REFLEXIVE URINE CULTURE  Menorrhagia with regular cycle   P:   Pap : due 2024  Mammogram: already scheduled, appointment is soon  Labs: with PCP  Medications: no new  Discussed possible therapy options for suspected adenomyosis: Ibuprofen  Micronor (daily progestin birth  control) Mirena IUD Lysteda Surgical options such as ablation  Pt to think about it and get back to office if desires

## 2020-08-13 ENCOUNTER — Encounter: Payer: Self-pay | Admitting: Nurse Practitioner

## 2020-08-13 ENCOUNTER — Ambulatory Visit (INDEPENDENT_AMBULATORY_CARE_PROVIDER_SITE_OTHER): Payer: BC Managed Care – PPO | Admitting: Nurse Practitioner

## 2020-08-13 ENCOUNTER — Other Ambulatory Visit: Payer: Self-pay

## 2020-08-13 VITALS — Ht 65.5 in | Wt 236.0 lb

## 2020-08-13 DIAGNOSIS — R350 Frequency of micturition: Secondary | ICD-10-CM | POA: Diagnosis not present

## 2020-08-13 DIAGNOSIS — Z01419 Encounter for gynecological examination (general) (routine) without abnormal findings: Secondary | ICD-10-CM | POA: Diagnosis not present

## 2020-08-13 DIAGNOSIS — N920 Excessive and frequent menstruation with regular cycle: Secondary | ICD-10-CM | POA: Diagnosis not present

## 2020-08-13 NOTE — Patient Instructions (Addendum)
Adenomyosis may be one reason for your symptoms.  If desired therapy options include: Ibuprofen  Micronor (daily progestin birth control) Mirena IUD Lysteda (take only when bleeding is heavy like days 2 and 3) Surgical options such as ablation   Health Maintenance, Female Adopting a healthy lifestyle and getting preventive care are important in promoting health and wellness. Ask your health care provider about:  The right schedule for you to have regular tests and exams.  Things you can do on your own to prevent diseases and keep yourself healthy. What should I know about diet, weight, and exercise? Eat a healthy diet  Eat a diet that includes plenty of vegetables, fruits, low-fat dairy products, and lean protein.  Do not eat a lot of foods that are high in solid fats, added sugars, or sodium.   Maintain a healthy weight Body mass index (BMI) is used to identify weight problems. It estimates body fat based on height and weight. Your health care provider can help determine your BMI and help you achieve or maintain a healthy weight. Get regular exercise Get regular exercise. This is one of the most important things you can do for your health. Most adults should:  Exercise for at least 150 minutes each week. The exercise should increase your heart rate and make you sweat (moderate-intensity exercise).  Do strengthening exercises at least twice a week. This is in addition to the moderate-intensity exercise.  Spend less time sitting. Even light physical activity can be beneficial. Watch cholesterol and blood lipids Have your blood tested for lipids and cholesterol at 44 years of age, then have this test every 5 years. Have your cholesterol levels checked more often if:  Your lipid or cholesterol levels are high.  You are older than 44 years of age.  You are at high risk for heart disease. What should I know about cancer screening? Depending on your health history and family  history, you may need to have cancer screening at various ages. This may include screening for:  Breast cancer.  Cervical cancer.  Colorectal cancer.  Skin cancer.  Lung cancer. What should I know about heart disease, diabetes, and high blood pressure? Blood pressure and heart disease  High blood pressure causes heart disease and increases the risk of stroke. This is more likely to develop in people who have high blood pressure readings, are of African descent, or are overweight.  Have your blood pressure checked: ? Every 3-5 years if you are 70-44 years of age. ? Every year if you are 58 years old or older. Diabetes Have regular diabetes screenings. This checks your fasting blood sugar level. Have the screening done:  Once every three years after age 44 if you are at a normal weight and have a low risk for diabetes.  More often and at a younger age if you are overweight or have a high risk for diabetes. What should I know about preventing infection? Hepatitis B If you have a higher risk for hepatitis B, you should be screened for this virus. Talk with your health care provider to find out if you are at risk for hepatitis B infection. Hepatitis C Testing is recommended for:  Everyone born from [birth year] through 1965.  Anyone with known risk factors for hepatitis C. Sexually transmitted infections (STIs)  Get screened for STIs, including gonorrhea and chlamydia, if: ? You are sexually active and are younger than 44 years of age. ? You are older than 44 years of age and  your health care provider tells you that you are at risk for this type of infection. ? Your sexual activity has changed since you were last screened, and you are at increased risk for chlamydia or gonorrhea. Ask your health care provider if you are at risk.  Ask your health care provider about whether you are at high risk for HIV. Your health care provider may recommend a prescription medicine to help prevent HIV  infection. If you choose to take medicine to prevent HIV, you should first get tested for HIV. You should then be tested every 3 months for as long as you are taking the medicine. Pregnancy  If you are about to stop having your period (premenopausal) and you may become pregnant, seek counseling before you get pregnant.  Take 400 to 800 micrograms (mcg) of folic acid every day if you become pregnant.  Ask for birth control (contraception) if you want to prevent pregnancy. Osteoporosis and menopause Osteoporosis is a disease in which the bones lose minerals and strength with aging. This can result in bone fractures. If you are 64 years old or older, or if you are at risk for osteoporosis and fractures, ask your health care provider if you should:  Be screened for bone loss.  Take a calcium or vitamin D supplement to lower your risk of fractures.  Be given hormone replacement therapy (HRT) to treat symptoms of menopause. Follow these instructions at home: Lifestyle  Do not use any products that contain nicotine or tobacco, such as cigarettes, e-cigarettes, and chewing tobacco. If you need help quitting, ask your health care provider.  Do not use street drugs.  Do not share needles.  Ask your health care provider for help if you need support or information about quitting drugs. Alcohol use  Do not drink alcohol if: ? Your health care provider tells you not to drink. ? You are pregnant, may be pregnant, or are planning to become pregnant.  If you drink alcohol: ? Limit how much you use to 0-1 drink a day. ? Limit intake if you are breastfeeding.  Be aware of how much alcohol is in your drink. In the [place] one drink equals one 12 oz bottle of beer (355 mL), one 5 oz glass of wine (148 mL), or one 1 oz glass of hard liquor (44 mL). General instructions  Schedule regular health, dental, and eye exams.  Stay current with your vaccines.  Tell your health care provider if: ? You  often feel depressed. ? You have ever been abused or do not feel safe at home. Summary  Adopting a healthy lifestyle and getting preventive care are important in promoting health and wellness.  Follow your health care provider's instructions about healthy diet, exercising, and getting tested or screened for diseases.  Follow your health care provider's instructions on monitoring your cholesterol and blood pressure. This information is not intended to replace advice given to you by your health care provider. Make sure you discuss any questions you have with your health care provider. Document Revised: 03/03/2018 Document Reviewed: 03/03/2018 Elsevier Patient Education  2021 [employer].

## 2020-08-15 LAB — URINALYSIS, COMPLETE W/RFL CULTURE
Bilirubin Urine: NEGATIVE
Glucose, UA: NEGATIVE
Hgb urine dipstick: NEGATIVE
Hyaline Cast: NONE SEEN /LPF
Ketones, ur: NEGATIVE
Leukocyte Esterase: NEGATIVE
Nitrites, Initial: NEGATIVE
Protein, ur: NEGATIVE
RBC / HPF: NONE SEEN /HPF (ref 0–2)
Specific Gravity, Urine: 1.025 (ref 1.001–1.035)
pH: 6 (ref 5.0–8.0)

## 2020-08-15 LAB — URINE CULTURE
MICRO NUMBER:: 11922176
SPECIMEN QUALITY:: ADEQUATE

## 2020-08-15 LAB — CULTURE INDICATED

## 2021-06-25 ENCOUNTER — Other Ambulatory Visit: Payer: Self-pay | Admitting: Family Medicine

## 2021-06-25 ENCOUNTER — Other Ambulatory Visit: Payer: Self-pay | Admitting: Nurse Practitioner

## 2021-06-25 DIAGNOSIS — Z1231 Encounter for screening mammogram for malignant neoplasm of breast: Secondary | ICD-10-CM

## 2021-06-27 ENCOUNTER — Ambulatory Visit
Admission: RE | Admit: 2021-06-27 | Discharge: 2021-06-27 | Disposition: A | Discharge status: Home / Self Care | Payer: BC Managed Care – PPO | Source: Ambulatory Visit | Attending: Nurse Practitioner | Admitting: Nurse Practitioner

## 2021-06-27 DIAGNOSIS — Z1231 Encounter for screening mammogram for malignant neoplasm of breast: Secondary | ICD-10-CM

## 2021-10-14 ENCOUNTER — Encounter: Payer: Self-pay | Admitting: Radiology

## 2021-10-14 ENCOUNTER — Ambulatory Visit (INDEPENDENT_AMBULATORY_CARE_PROVIDER_SITE_OTHER): Payer: BC Managed Care – PPO | Admitting: Radiology

## 2021-10-14 ENCOUNTER — Other Ambulatory Visit (HOSPITAL_COMMUNITY)
Admission: RE | Admit: 2021-10-14 | Discharge: 2021-10-14 | Disposition: A | Discharge status: Home / Self Care | Payer: BC Managed Care – PPO | Source: Ambulatory Visit | Attending: Radiology | Admitting: Radiology

## 2021-10-14 VITALS — BP 106/74 | Ht 65.5 in | Wt 240.0 lb

## 2021-10-14 DIAGNOSIS — Z01419 Encounter for gynecological examination (general) (routine) without abnormal findings: Secondary | ICD-10-CM

## 2021-10-14 DIAGNOSIS — N920 Excessive and frequent menstruation with regular cycle: Secondary | ICD-10-CM

## 2021-10-14 DIAGNOSIS — D500 Iron deficiency anemia secondary to blood loss (chronic): Secondary | ICD-10-CM

## 2021-10-14 DIAGNOSIS — R350 Frequency of micturition: Secondary | ICD-10-CM | POA: Diagnosis not present

## 2021-10-14 NOTE — Progress Notes (Signed)
Ashley Lane 1976/01/08 [id number]   History:  46 y.o. G3P3 presents for annual exam. She reports heavy periods with clotting. Hx of anemia, has not had labs checked recently. Fatigue with menses. Sometimes takes an iron supplement when she is menstruating. Took liquid IV this past cycle which helped. Does reports brain fog and breast tenderness before period begins.   Gynecologic History Patient's last menstrual period was 10/06/2021 (exact date). Period Cycle (Days): 28 Period Duration (Days): 4 Period Pattern: Regular Menstrual Flow: Heavy Dysmenorrhea: None Contraception/Family planning: vasectomy Sexually active: yes Last Pap: 2019. Results were: normal Last mammogram: 06/27/21. Results were: normal  Obstetric History OB History  Gravida Para Term Preterm AB Living  3 3 3  0 0 3  SAB IAB Ectopic Multiple Live Births  0 0 0 0 3    # Outcome Date GA Lbr Len/2nd Weight Sex Delivery Anes PTL Lv  3 Term 03/19/12 [redacted]w[redacted]d 05:07 / 00:19 8 lb 2.9 oz (3.71 kg) F Vag-Spont EPI  LIV  2 Term 2003 [redacted]w[redacted]d 08:00 8 lb 10 oz (3.912 kg) F Vag-Spont EPI  LIV  1 Term 1999 [redacted]w[redacted]d 06:00 9 lb 15 oz (4.508 kg) M Vag-Spont EPI  LIV     The following portions of the patient's history were reviewed and updated as appropriate: allergies, current medications, past family history, past medical history, past social history, past surgical history, and problem list.  Review of Systems Pertinent items noted in HPI and remainder of comprehensive ROS otherwise negative.   Past medical history, past surgical history, family history and social history were all reviewed and documented in the EPIC chart.   Exam:  Vitals:   10/14/21 1443  BP: 106/74  Weight: 240 lb (108.9 kg)  Height: 5' 5.5" (1.664 m)   Body mass index is 39.33 kg/m.  General appearance:  Normal, obese Thyroid:  Symmetrical, normal in size, without palpable masses or nodularity. Respiratory  Auscultation:  Clear without wheezing or  rhonchi Cardiovascular  Auscultation:  Regular rate, without rubs, murmurs or gallops  Edema/varicosities:  Not grossly evident Abdominal  Soft,nontender, without masses, guarding or rebound.  Liver/spleen:  No organomegaly noted  Hernia:  None appreciated  Skin  Inspection:  Grossly normal Breasts: Examined lying and sitting.   Right: Without masses, retractions, nipple discharge or axillary adenopathy.   Left: Without masses, retractions, nipple discharge or axillary adenopathy. Genitourinary   Inguinal/mons:  Normal without inguinal adenopathy  External genitalia:  Normal appearing vulva with no masses, tenderness, or lesions  BUS/Urethra/Skene's glands:  Normal without masses or exudate  Vagina:  Normal appearing with normal color and discharge, no lesions  Cervix:  Normal appearing without discharge or lesions  Uterus:  Normal in size, shape and contour.  Mobile, nontender  Adnexa/parametria:     Rt: Normal in size, without masses or tenderness.   Lt: Normal in size, without masses or tenderness.  Anus and perineum: Normal   Patient informed chaperone available to be present for breast and pelvic exam. Patient has requested no chaperone to be present. Patient has been advised what will be completed during breast and pelvic exam.   Assessment/Plan:   1. Well woman exam with routine gynecological exam  - Cytology - PAP( [facility]) - Premenstrual breast pain- vit E 400iu BID x 5-7days pre menses - Brain fog- Neuro Mag nightly  2. Menorrhagia with regular cycle Declines hormonal management or lysteda Encouraged to consider a progestin only option  3. Iron deficiency anemia due  to chronic blood loss Levels have not been checked since 2020 - CBC - B12 and Folate Panel - Ferritin  4. Frequency of urination  - Urinalysis,Complete w/RFL Culture    Discussed SBE, colonoscopy and DEXA screening as directed/appropriate. Recommend of exercise weekly, including  weight bearing exercise. Encouraged the use of seatbelts and sunscreen. Return in 1 year for annual or as needed.   Arlie Solomons B WHNP-BC 3:29 PM 10/14/2021

## 2021-10-15 LAB — CBC
HCT: 36.3 % (ref 35.0–45.0)
Hemoglobin: 12.2 g/dL (ref 11.7–15.5)
MCH: 28.8 pg (ref 27.0–33.0)
MCHC: 33.6 g/dL (ref 32.0–36.0)
MCV: 85.6 fL (ref 80.0–100.0)
MPV: 9.6 fL (ref 7.5–12.5)
Platelets: 448 10*3/uL — ABNORMAL HIGH (ref 140–400)
RBC: 4.24 10*6/uL (ref 3.80–5.10)
RDW: 12.5 % (ref 11.0–15.0)
WBC: 9.4 10*3/uL (ref 3.8–10.8)

## 2021-10-15 LAB — URINALYSIS, COMPLETE W/RFL CULTURE
Bacteria, UA: NONE SEEN /HPF
Bilirubin Urine: NEGATIVE
Glucose, UA: NEGATIVE
Hgb urine dipstick: NEGATIVE
Hyaline Cast: NONE SEEN /LPF
Ketones, ur: NEGATIVE
Leukocyte Esterase: NEGATIVE
Nitrites, Initial: NEGATIVE
Protein, ur: NEGATIVE
RBC / HPF: NONE SEEN /HPF (ref 0–2)
Specific Gravity, Urine: 1.026 (ref 1.001–1.035)
WBC, UA: NONE SEEN /HPF (ref 0–5)
pH: 5.5 (ref 5.0–8.0)

## 2021-10-15 LAB — EXTRA SPECIMEN

## 2021-10-15 LAB — B12 AND FOLATE PANEL
Folate: 12.3 ng/mL
Vitamin B-12: 328 pg/mL (ref 200–1100)

## 2021-10-15 LAB — FERRITIN: Ferritin: 15 ng/mL — ABNORMAL LOW (ref 16–232)

## 2021-10-15 LAB — NO CULTURE INDICATED

## 2021-10-16 LAB — CYTOLOGY - PAP
Comment: NEGATIVE
Diagnosis: NEGATIVE
High risk HPV: NEGATIVE

## 2021-12-30 ENCOUNTER — Ambulatory Visit (INDEPENDENT_AMBULATORY_CARE_PROVIDER_SITE_OTHER): Payer: BC Managed Care – PPO

## 2021-12-30 ENCOUNTER — Ambulatory Visit: Payer: BC Managed Care – PPO | Admitting: Podiatry

## 2021-12-30 DIAGNOSIS — M722 Plantar fascial fibromatosis: Secondary | ICD-10-CM

## 2021-12-30 MED ORDER — MELOXICAM 15 MG PO TABS: 15.0000 mg | ORAL_TABLET | Freq: Every day | ORAL | 1 refills | Status: DC

## 2021-12-30 MED ORDER — BETAMETHASONE SOD PHOS & ACET 6 (3-3) MG/ML IJ SUSP
3.0000 mg | Freq: Once | INTRAMUSCULAR | Status: AC
  Administered 2021-12-30: 3 mg via INTRA_ARTICULAR

## 2021-12-30 NOTE — Progress Notes (Signed)
   Chief Complaint  Patient presents with   Foot Pain    Patient is here for left foot pain, she has had pain for 3 months and is getting worse.    Subjective: 46 y.o. female presenting to the office today as a new patient for evaluation of left heel pain has been going on for about 3 months ever since she went to the beach.  She does have a history of plantar fasciitis and has been treated in the past by Dr. Gershon Mussel, local podiatrist.  Patient presents for further treatment and evaluation.  Currently she has not done anything for treatment other than good supportive running shoes   Past Medical History:  Diagnosis Date   AMA (advanced maternal age) multigravida 35+    Anemia    hx   H/O varicella    Hx of pyelonephritis      Objective: Physical Exam General: The patient is alert and oriented x3 in no acute distress.  Dermatology: Skin is warm, dry and supple bilateral lower extremities. Negative for open lesions or macerations bilateral.   Vascular: Dorsalis Pedis and Posterior Tibial pulses palpable bilateral.  Capillary fill time is immediate to all digits.  Neurological: Epicritic and protective threshold intact bilateral.   Musculoskeletal: Tenderness to palpation to the plantar aspect of the left heel along the plantar fascia. All other joints range of motion within normal limits bilateral. Strength 5/5 in all groups bilateral.   Radiographic exam: Normal osseous mineralization. Joint spaces preserved. No fracture/dislocation/boney destruction. No other soft tissue abnormalities or radiopaque foreign bodies.  Small plantar heel spur noted  Assessment: 1. Plantar fasciitis left foot  Plan of Care:  1. Patient evaluated. Xrays reviewed.   2. Injection of 0.5cc Celestone soluspan injected into the left plantar fascia.  3. Declined Medrol Dosepak 4. Rx for Meloxicam ordered for patient. 5. Plantar fascial band(s) dispensed  6. Instructed patient regarding therapies and  modalities at home to alleviate symptoms.  7. Return to clinic in 4 weeks.    Rachel Bo Neis's cousin.  Photographer.  [place]  Edrick Kins, DPM [facility]  Dr. Edrick Kins, DPM    [address], [place] [address]                Office [phone]  Fax [phone]

## 2022-01-29 ENCOUNTER — Encounter (HOSPITAL_BASED_OUTPATIENT_CLINIC_OR_DEPARTMENT_OTHER): Payer: Self-pay | Admitting: Obstetrics & Gynecology

## 2022-02-04 ENCOUNTER — Ambulatory Visit: Payer: BC Managed Care – PPO | Admitting: Podiatry

## 2022-02-04 DIAGNOSIS — M722 Plantar fascial fibromatosis: Secondary | ICD-10-CM

## 2022-02-04 NOTE — Progress Notes (Signed)
   Chief Complaint  Patient presents with   Follow-up    Patient is here for left heel pain.the patient states that she is is still having pain more in the heel.    Subjective: 46 y.o. female presenting to the office today for follow-up evaluation of plantar fasciitis to the left heel that is been ongoing for over 4 months now when she went to the beach.  She does have a history of plantar fasciitis in the past.  Patient states that for the following week after the injection she felt significantly better however the heel pain gradually returned.  She was unable to take the meloxicam due to side effects.   Past Medical History:  Diagnosis Date   AMA (advanced maternal age) multigravida 35+    Anemia    hx   H/O varicella    Hx of pyelonephritis      Objective: Physical Exam General: The patient is alert and oriented x3 in no acute distress.  Dermatology: Skin is warm, dry and supple bilateral lower extremities. Negative for open lesions or macerations bilateral.   Vascular: Dorsalis Pedis and Posterior Tibial pulses palpable bilateral.  Capillary fill time is immediate to all digits.  Neurological: Epicritic and protective threshold intact bilateral.   Musculoskeletal: There continues to be some tenderness to palpation to the plantar aspect of the left heel along the plantar fascia. All other joints range of motion within normal limits bilateral. Strength 5/5 in all groups bilateral.   Radiographic exam LT foot 12/30/2021: Normal osseous mineralization. Joint spaces preserved. No fracture/dislocation/boney destruction. No other soft tissue abnormalities or radiopaque foreign bodies.  Small plantar heel spur noted  Assessment: 1. Plantar fasciitis left foot  Plan of Care:  1. Patient evaluated.  2.  Patient had side effects with the meloxicam.  Prescription for Motrin 800 mg 2 times daily as needed 3.  Unfortunately the patient did not have good results with the plantar fascial  brace.  She may discontinue. 4.  OTC power step insoles were dispensed.  Wear daily as needed 5.  Patient is going to pursue dry needling at [employer] in [place] 6.  Return to clinic as needed  *Amalia Hailey Fotheringham's cousin.  Photographer.    Felecia Shelling, DPM [facility]  Dr. Felecia Shelling, DPM    [address] [place], [place] [address]                Office [phone]  Fax [phone]

## 2022-02-12 ENCOUNTER — Ambulatory Visit (HOSPITAL_BASED_OUTPATIENT_CLINIC_OR_DEPARTMENT_OTHER): Payer: BC Managed Care – PPO | Admitting: Obstetrics & Gynecology

## 2022-02-12 ENCOUNTER — Encounter (HOSPITAL_BASED_OUTPATIENT_CLINIC_OR_DEPARTMENT_OTHER): Payer: Self-pay | Admitting: Obstetrics & Gynecology

## 2022-02-12 VITALS — BP 124/83 | HR 92 | Ht 66.5 in | Wt 237.8 lb

## 2022-02-12 DIAGNOSIS — N920 Excessive and frequent menstruation with regular cycle: Secondary | ICD-10-CM

## 2022-02-12 DIAGNOSIS — N926 Irregular menstruation, unspecified: Secondary | ICD-10-CM | POA: Diagnosis not present

## 2022-02-12 DIAGNOSIS — R790 Abnormal level of blood mineral: Secondary | ICD-10-CM

## 2022-02-12 DIAGNOSIS — Z8632 Personal history of gestational diabetes: Secondary | ICD-10-CM

## 2022-02-12 NOTE — Progress Notes (Signed)
GYNECOLOGY  VISIT  CC:   discuss hormonal concerns  HPI: 46 y.o. G2P3003 Married White or Caucasian female here for concerns regarding hormones.  She has seen provider at [facility] in July.  Pap smear was normal.  She did also have normal mammogram.  Is former patient of Dr. Audie Box and Maryelizabeth Rowan.  These providers have retired.  Hx of menorrhagia and anemia.  Had ferritin level in 09/2021 that was 15.    Cycles are now have more significant cramping and worsening breast pain.  Cycles are typically regular but last about 4-5 day.  2-3 days are heavy.  Does pass large clots when on her cycles.  This is better some with magnesium, omega 3, iron with b 12/folic acid/vit C.    Has noted lymph node enlargement that was present in the last couple of months.  Had changed deodorants.  She is wondering if this contributes.   Past Medical History:  Diagnosis Date   AMA (advanced maternal age) multigravida 35+    Anemia    hx   H/O varicella    Hx of pyelonephritis     MEDS:   No current outpatient medications on file prior to visit.   No current facility-administered medications on file prior to visit.    ALLERGIES: Dicyclomine hcl, Nitrofurantoin, and Latex  SH:  married, non smoker  Review of Systems  Constitutional: Negative.   Genitourinary:        Menorrhagia    PHYSICAL EXAMINATION:    BP 124/83 (BP Location: Right Arm, Patient Position: Sitting, Cuff Size: Large)   Pulse 92   Ht 5' 6.5" (1.689 m) Comment: Reported  Wt 237 lb 12.8 oz (107.9 kg)   LMP 02/03/2022 (Approximate)   BMI 37.81 kg/m     General appearance: alert, cooperative and appears stated age Breasts: normal appearance, no masses or tenderness, no axillary masses, no nipple discharge Lymph:  no inguinal LAD noted   Assessment/Plan: 1. Low ferritin - Iron, TIBC and Ferritin Panel  2. Menstrual changes - Follicle stimulating hormone - Estradiol - Progesterone  3. Menorrhagia with regular cycle - US  PELVIC COMPLETE WITH TRANSVAGINAL; Future  4. History of gestational diabetes - Hemoglobin A1c

## 2022-02-13 LAB — IRON,TIBC AND FERRITIN PANEL
Ferritin: 38 ng/mL (ref 15–150)
Iron Saturation: 23 % (ref 15–55)
Iron: 72 ug/dL (ref 27–159)
Total Iron Binding Capacity: 312 ug/dL (ref 250–450)
UIBC: 240 ug/dL (ref 131–425)

## 2022-02-13 LAB — HEMOGLOBIN A1C
Est. average glucose Bld gHb Est-mCnc: 177 mg/dL
Hgb A1c MFr Bld: 7.8 % — ABNORMAL HIGH (ref 4.8–5.6)

## 2022-02-13 LAB — PROGESTERONE: Progesterone: 0.1 ng/mL

## 2022-02-13 LAB — FOLLICLE STIMULATING HORMONE: FSH: 8.9 m[IU]/mL

## 2022-02-13 LAB — ESTRADIOL: Estradiol: 60.2 pg/mL

## 2022-02-17 ENCOUNTER — Encounter (HOSPITAL_BASED_OUTPATIENT_CLINIC_OR_DEPARTMENT_OTHER): Payer: Self-pay | Admitting: Obstetrics & Gynecology

## 2022-02-17 ENCOUNTER — Other Ambulatory Visit (HOSPITAL_BASED_OUTPATIENT_CLINIC_OR_DEPARTMENT_OTHER): Payer: Self-pay | Admitting: *Deleted

## 2022-02-17 DIAGNOSIS — E119 Type 2 diabetes mellitus without complications: Secondary | ICD-10-CM | POA: Insufficient documentation

## 2022-02-17 MED ORDER — NORETHINDRONE 0.35 MG PO TABS: 1.0000 | ORAL_TABLET | Freq: Every day | ORAL | 3 refills | Status: DC

## 2022-03-11 ENCOUNTER — Ambulatory Visit (HOSPITAL_BASED_OUTPATIENT_CLINIC_OR_DEPARTMENT_OTHER): Admission: RE | Admit: 2022-03-11 | Payer: BC Managed Care – PPO | Source: Ambulatory Visit

## 2022-03-11 ENCOUNTER — Encounter (HOSPITAL_BASED_OUTPATIENT_CLINIC_OR_DEPARTMENT_OTHER): Payer: Self-pay

## 2022-05-18 ENCOUNTER — Telehealth: Payer: Self-pay | Admitting: Obstetrics and Gynecology

## 2022-05-18 ENCOUNTER — Encounter: Payer: Self-pay | Admitting: Obstetrics and Gynecology

## 2022-05-18 DIAGNOSIS — N921 Excessive and frequent menstruation with irregular cycle: Secondary | ICD-10-CM

## 2022-05-18 MED ORDER — TRANEXAMIC ACID 650 MG PO TABS: 1300.0000 mg | ORAL_TABLET | Freq: Three times a day (TID) | ORAL | 0 refills | Status: DC

## 2022-05-18 NOTE — Telephone Encounter (Signed)
Pt called answering service. LMP was 2/5 and she started bleeding again on 2/22. Bleeding started light but became heavier yesterday. She was changing her pads about every 2 hours, now about every hour and a half (not completely full), and has noticed some nickel sized clots. Periods have been regular previously but she has had bleeding with clots, although not recently. Seen by Dr Sabra Heck a few months ago and was prescribed norethindrone after getting blood work but she was hesitant to start it without a follow up discussion. She has an appt scheduled this week 2/29. Also had ordered an ultrasound but it was not covered by her insurance at the time.   Will prescribed lysteda '1300mg'$  TID x 5 days. Advised patient to call back if she fills >1 pad per hour or passing larger sized clots. Otherwise can plan to f/u with Dr Sabra Heck at her scheduled appt on Thursday.   Jaquita Folds, MD

## 2022-05-22 ENCOUNTER — Encounter (HOSPITAL_BASED_OUTPATIENT_CLINIC_OR_DEPARTMENT_OTHER): Payer: Self-pay | Admitting: Obstetrics & Gynecology

## 2022-05-22 ENCOUNTER — Ambulatory Visit (HOSPITAL_BASED_OUTPATIENT_CLINIC_OR_DEPARTMENT_OTHER): Payer: BC Managed Care – PPO | Admitting: Obstetrics & Gynecology

## 2022-05-22 VITALS — BP 110/80 | HR 92 | Ht 66.0 in | Wt 237.2 lb

## 2022-05-22 DIAGNOSIS — R7989 Other specified abnormal findings of blood chemistry: Secondary | ICD-10-CM

## 2022-05-22 DIAGNOSIS — Z862 Personal history of diseases of the blood and blood-forming organs and certain disorders involving the immune mechanism: Secondary | ICD-10-CM

## 2022-05-22 DIAGNOSIS — R7309 Other abnormal glucose: Secondary | ICD-10-CM | POA: Diagnosis not present

## 2022-05-22 DIAGNOSIS — N921 Excessive and frequent menstruation with irregular cycle: Secondary | ICD-10-CM

## 2022-05-22 NOTE — Progress Notes (Signed)
GYNECOLOGY  VISIT  CC:   menorrhagia  HPI: 47 y.o. G21P3003 Married White or Caucasian female here for concerns about having a cycle early this months.  Had what felt like a completely normal cycle and then turned around and had another cycle that felt completely normal but came very early.  She has hx of menorrhagia and I saw her originally back in October.  At that time, she was also having issues with iron deficiency.  She had been unable to tolerate oral iron but has found a food based iron that seems to have worked for her.  Also, at that time, we discussed evaluation with ultrasound and I recommended starting progesterone only pills.  Ultrasound was too expensive from cost standpoint with radiology so she did not have this done.  As well, she was nervous about starting a hormone and never started the progesterone.  Today, she states she would prefer to be hormone free, if everything is otherwise ok.    She was nervous about it being early.    She had elevated Hba1c of 7.8.  Has not started medication.  Feels elevated cortisol with stressors is part of the problem.  Is trying to decrease stressors.  We discussed the importance of treatment as symptoms of diabetes are silent but, long term, can be detrimental to eyes, kidneys, and heart, in particular.    From anemia standpoint, she does feel better.  Iron and ferritin have improved as well.  She feels if she can control the anemia, and heavy bleeding is otherwise not dangerous, would prefer to be conservative with management.    Also, she did call on call provider who recommended TXA.  Pt did not feel comfortable with this either so didn't get it filled.   Past Medical History:  Diagnosis Date   AMA (advanced maternal age) multigravida 35+    Anemia    hx   Diabetes mellitus ([facility])    H/O varicella    Hx of pyelonephritis     MEDS:   Current Outpatient Medications on File Prior to Visit  Medication Sig Dispense Refill   norethindrone  (MICRONOR) 0.35 MG tablet Take 1 tablet (0.35 mg total) by mouth daily. (Patient not taking: Reported on 05/22/2022) 28 tablet 3   tranexamic acid (LYSTEDA) 650 MG TABS tablet Take 2 tablets (1,300 mg total) by mouth 3 (three) times daily for 5 days. (Patient not taking: Reported on 05/22/2022) 30 tablet 0   No current facility-administered medications on file prior to visit.    ALLERGIES: Dicyclomine hcl, Nitrofurantoin, and Latex  SH:  married, non smoker  Review of Systems  Genitourinary:        Early cycle, heavy flow    PHYSICAL EXAMINATION:    BP 110/80 (BP Location: Left Arm, Patient Position: Sitting, Cuff Size: Large)   Pulse 92   Ht '5\' 6"'$  (1.676 m) Comment: Reported  Wt 237 lb 3.2 oz (107.6 kg)   BMI 38.29 kg/m     General appearance: alert, cooperative and appears stated age Lymph:  no inguinal LAD noted  Pelvic: External genitalia:  no lesions              Urethra:  normal appearing urethra with no masses, tenderness or lesions              Bartholins and Skenes: normal                 Vagina: normal appearing vagina with normal color and  discharge, no lesions              Cervix: no lesions              Bimanual Exam:  Uterus:  normal size, contour, position, consistency, mobility, non-tender              Adnexa: no mass, fullness, tenderness  Chaperone, Octaviano Batty, CMA, was present for exam.  Assessment/Plan: 1. Menorrhagia with irregular cycle - will try to do u/s in office to decrease cost for pt - US PELVIS TRANSVAGINAL NON-OB (TV ONLY); Future  2. Elevated platelet count - likely due to iron deficiency in the past.  Hopefully this will improve with improved iron and ferritin levels - CBC  3. Elevated hemoglobin A1c - will repeat today as pt has not had follow up yet. - Hemoglobin A1c  Total time with pt:  29 minutes.  Time with documentation;  4 minutes.  Total time:  33 minutes.

## 2022-05-23 LAB — CBC
Hematocrit: 37 % (ref 34.0–46.6)
Hemoglobin: 12.5 g/dL (ref 11.1–15.9)
MCH: 29.3 pg (ref 26.6–33.0)
MCHC: 33.8 g/dL (ref 31.5–35.7)
MCV: 87 fL (ref 79–97)
Platelets: 415 10*3/uL (ref 150–450)
RBC: 4.26 x10E6/uL (ref 3.77–5.28)
RDW: 12.1 % (ref 11.7–15.4)
WBC: 7.2 10*3/uL (ref 3.4–10.8)

## 2022-05-23 LAB — HEMOGLOBIN A1C
Est. average glucose Bld gHb Est-mCnc: 166 mg/dL
Hgb A1c MFr Bld: 7.4 % — ABNORMAL HIGH (ref 4.8–5.6)

## 2022-05-25 DIAGNOSIS — Z862 Personal history of diseases of the blood and blood-forming organs and certain disorders involving the immune mechanism: Secondary | ICD-10-CM | POA: Insufficient documentation

## 2022-06-04 ENCOUNTER — Ambulatory Visit (HOSPITAL_BASED_OUTPATIENT_CLINIC_OR_DEPARTMENT_OTHER): Payer: BC Managed Care – PPO | Admitting: Obstetrics & Gynecology

## 2022-06-04 ENCOUNTER — Ambulatory Visit (INDEPENDENT_AMBULATORY_CARE_PROVIDER_SITE_OTHER): Payer: BC Managed Care – PPO

## 2022-06-04 ENCOUNTER — Encounter (HOSPITAL_BASED_OUTPATIENT_CLINIC_OR_DEPARTMENT_OTHER): Payer: Self-pay | Admitting: Obstetrics & Gynecology

## 2022-06-04 VITALS — BP 127/82 | HR 90 | Ht 66.0 in | Wt 238.8 lb

## 2022-06-04 DIAGNOSIS — N921 Excessive and frequent menstruation with irregular cycle: Secondary | ICD-10-CM

## 2022-06-04 DIAGNOSIS — N83201 Unspecified ovarian cyst, right side: Secondary | ICD-10-CM

## 2022-06-04 DIAGNOSIS — N920 Excessive and frequent menstruation with regular cycle: Secondary | ICD-10-CM

## 2022-06-07 NOTE — Progress Notes (Signed)
GYNECOLOGY  VISIT  CC:   discussion of ultrasound, h/o menorrhagia  HPI: 47 y.o. G16P3003 Married White or Caucasian female here for discussion of ultrasound findings.  Pt has hx of menorrhagia but has decided that she is managing this adequately and does not want to be on any hormonal therapy or proceed with any treatment.  Felt ultrasound reasonable to evaluate endometrium.  She also has improved iron levels because she found a plant based iron she can take.    Right ovarian cyst noted on ultrasound.  This ovary does have perfusion.  Feel, given it's size, should recheck.  Feel we should repeat this in 6-8 weeks to ensure this has resolved.  Pt comfortable with plan.  Past Medical History:  Diagnosis Date   AMA (advanced maternal age) multigravida 35+    Anemia    hx   Diabetes mellitus ([facility])    H/O varicella    Hx of pyelonephritis     MEDS:   No current outpatient medications on file prior to visit.   No current facility-administered medications on file prior to visit.    ALLERGIES: Dicyclomine hcl, Nitrofurantoin, and Latex  SH:  married, non smoker  Review of Systems  Constitutional: Negative.   Genitourinary:        H/o menorrhagia    PHYSICAL EXAMINATION:    BP 127/82 (BP Location: Left Arm, Patient Position: Sitting, Cuff Size: Large)   Pulse 90   Ht 5\' 6"  (1.676 m) Comment: Reported  Wt 238 lb 12.8 oz (108.3 kg)   BMI 38.54 kg/m     General appearance: alert, cooperative and appears stated age   Assessment/Plan: 1. Menorrhagia with regular cycle - will continue monitor conservatively.  Ultrasound reassuring.  Pt tolerating bleeding well and iron levels have improved  2. Right ovarian cyst - recheck ultrasound 6-8 weeks. - US PELVIS TRANSVAGINAL NON-OB (TV ONLY); Future  Total time with pt:  26 minutes

## 2022-06-09 ENCOUNTER — Telehealth (HOSPITAL_BASED_OUTPATIENT_CLINIC_OR_DEPARTMENT_OTHER): Payer: Self-pay | Admitting: Obstetrics & Gynecology

## 2022-06-09 NOTE — Telephone Encounter (Signed)
Called patient and left a message to please call the office to set up ultrasound in 6 weeks .

## 2022-08-06 ENCOUNTER — Ambulatory Visit (HOSPITAL_BASED_OUTPATIENT_CLINIC_OR_DEPARTMENT_OTHER): Payer: BC Managed Care – PPO | Admitting: Obstetrics & Gynecology

## 2022-08-06 ENCOUNTER — Encounter (HOSPITAL_BASED_OUTPATIENT_CLINIC_OR_DEPARTMENT_OTHER): Payer: Self-pay | Admitting: Obstetrics & Gynecology

## 2022-08-06 ENCOUNTER — Ambulatory Visit (INDEPENDENT_AMBULATORY_CARE_PROVIDER_SITE_OTHER): Payer: BC Managed Care – PPO

## 2022-08-06 VITALS — BP 113/74 | Ht 66.5 in | Wt 239.6 lb

## 2022-08-06 DIAGNOSIS — Z862 Personal history of diseases of the blood and blood-forming organs and certain disorders involving the immune mechanism: Secondary | ICD-10-CM | POA: Diagnosis not present

## 2022-08-06 DIAGNOSIS — N83201 Unspecified ovarian cyst, right side: Secondary | ICD-10-CM

## 2022-08-06 DIAGNOSIS — N83202 Unspecified ovarian cyst, left side: Secondary | ICD-10-CM | POA: Diagnosis not present

## 2022-08-06 DIAGNOSIS — R7303 Prediabetes: Secondary | ICD-10-CM

## 2022-08-06 NOTE — Progress Notes (Signed)
GYNECOLOGY  VISIT  CC:   ultrasound follow up  HPI: 47 y.o. G57P3003 Married White or Caucasian female here for follow up ultrasound for recheck of right ovarian cyst noted with ultrasound 06/04/2022.  Ovarian cyst was 4.2cm.  Ultrasound today showed this was resolved.  However, there is a new cyst/follicle on the left ovary.  It is small and has completley simple appearance.  Given size and resolution of prior larger right cyst, do not feel additional imaging necessary unless she has new symptoms.    Cycles seem to have normalized for her.  She is not on any hormonal therapy.  Pt is taking oral iron supplement and feeling better as well.  Will continue.  Hb A1c 7.4.  She is exercising really regularly.  Does need follow up at at least 3 months after prior one on 05/22/2022.  Future lab ordered and lab appt scheduled for pt today while in office.   Past Medical History:  Diagnosis Date   AMA (advanced maternal age) multigravida 35+    Anemia    hx   Diabetes mellitus ([facility])    H/O varicella    Hx of pyelonephritis     MEDS:   No current outpatient medications on file prior to visit.   No current facility-administered medications on file prior to visit.    ALLERGIES: Dicyclomine hcl, Nitrofurantoin, and Latex  SH:  married, non smoker  Review of Systems  Constitutional: Negative.   Genitourinary:        H/o menorrhagia    PHYSICAL EXAMINATION:    BP 113/74 (BP Location: Right Arm, Patient Position: Sitting, Cuff Size: Large)   Ht 5' 6.5" (1.689 m) Comment: Reported  Wt 239 lb 9.6 oz (108.7 kg)   LMP 08/01/2022   BMI 38.09 kg/m     General appearance: alert, cooperative and appears stated age  Assessment/Plan: 1. Right ovarian cyst - resolved with current ultrasound  2. Left ovarian cyst - simple ovarian cyst present.  No follow up recommended.    3. Prediabetes - she is going to return in June for repeat hba1c - Hemoglobin A1c; Future  4. History of anemia - lab  work done 05/22/2022 showed hb of 12.5 and platelet count was improved as well - she will continue her plant based iron supplement which does seem to have helped

## 2022-09-08 ENCOUNTER — Other Ambulatory Visit (HOSPITAL_BASED_OUTPATIENT_CLINIC_OR_DEPARTMENT_OTHER): Payer: BC Managed Care – PPO

## 2022-09-08 DIAGNOSIS — R7303 Prediabetes: Secondary | ICD-10-CM

## 2022-09-08 LAB — HEMOGLOBIN A1C
Est. average glucose Bld gHb Est-mCnc: 169 mg/dL
Hgb A1c MFr Bld: 7.5 % — ABNORMAL HIGH (ref 4.8–5.6)

## 2022-09-10 ENCOUNTER — Encounter (HOSPITAL_BASED_OUTPATIENT_CLINIC_OR_DEPARTMENT_OTHER): Payer: Self-pay | Admitting: Obstetrics & Gynecology

## 2022-09-10 DIAGNOSIS — R7309 Other abnormal glucose: Secondary | ICD-10-CM

## 2022-09-10 DIAGNOSIS — R7303 Prediabetes: Secondary | ICD-10-CM

## 2022-09-11 ENCOUNTER — Other Ambulatory Visit (HOSPITAL_BASED_OUTPATIENT_CLINIC_OR_DEPARTMENT_OTHER): Payer: Self-pay | Admitting: Obstetrics & Gynecology

## 2023-04-28 ENCOUNTER — Other Ambulatory Visit: Payer: Self-pay | Admitting: Obstetrics & Gynecology

## 2023-04-28 DIAGNOSIS — Z1231 Encounter for screening mammogram for malignant neoplasm of breast: Secondary | ICD-10-CM

## 2023-05-07 ENCOUNTER — Ambulatory Visit: Payer: BC Managed Care – PPO

## 2023-11-25 ENCOUNTER — Encounter: Payer: Self-pay | Admitting: Radiology

## 2023-12-04 ENCOUNTER — Encounter: Payer: Self-pay | Admitting: Radiology

## 2023-12-04 ENCOUNTER — Ambulatory Visit: Admitting: Radiology

## 2023-12-04 VITALS — BP 112/74 | Wt 229.0 lb

## 2023-12-04 DIAGNOSIS — N393 Stress incontinence (female) (male): Secondary | ICD-10-CM

## 2023-12-04 DIAGNOSIS — N951 Menopausal and female climacteric states: Secondary | ICD-10-CM | POA: Diagnosis not present

## 2023-12-04 DIAGNOSIS — N912 Amenorrhea, unspecified: Secondary | ICD-10-CM

## 2023-12-04 DIAGNOSIS — R350 Frequency of micturition: Secondary | ICD-10-CM | POA: Diagnosis not present

## 2023-12-04 DIAGNOSIS — N898 Other specified noninflammatory disorders of vagina: Secondary | ICD-10-CM

## 2023-12-04 DIAGNOSIS — R1032 Left lower quadrant pain: Secondary | ICD-10-CM

## 2023-12-04 LAB — NO CULTURE INDICATED

## 2023-12-04 LAB — URINALYSIS, COMPLETE W/RFL CULTURE
Bacteria, UA: NONE SEEN /HPF
Hgb urine dipstick: NEGATIVE
Hyaline Cast: NONE SEEN /LPF
Leukocyte Esterase: NEGATIVE
Nitrites, Initial: NEGATIVE
RBC / HPF: NONE SEEN /HPF (ref 0–2)
Specific Gravity, Urine: 1.025 (ref 1.001–1.035)
WBC, UA: NONE SEEN /HPF (ref 0–5)
pH: 5.5 (ref 5.0–8.0)

## 2023-12-04 LAB — PREGNANCY, URINE: Preg Test, Ur: NEGATIVE

## 2023-12-04 LAB — WET PREP FOR TRICH, YEAST, CLUE

## 2023-12-04 NOTE — Progress Notes (Signed)
   Ashley Lane 07-04-1975 [id number]   History:  48 y.o. G3P3 c/o irregular periods x 3 months.LMP 11/11/23, PMP 09/24/23. Also complains of urinary frequency, urgency, incontinence, vaginal odor, discharge, odor in urine. Symptoms began 2 weeks ago. Also reports LLQ pain, bloating. Urinary incontinence, wears a pad daily.  Has tried PFPT years ago.  Gynecologic History Patient's last menstrual period was 11/11/2023 (approximate).   Contraception/Family planning: vasectomy Sexually active: yes   Obstetric History OB History  Gravida Para Term Preterm AB Living  3 3 3  0 0 3  SAB IAB Ectopic Multiple Live Births  0 0 0 0 3    # Outcome Date GA Lbr Len/2nd Weight Sex Type Anes PTL Lv  3 Term 03/19/12 [redacted]w[redacted]d 05:07 / 00:19 8 lb 2.9 oz (3.71 kg) F Vag-Spont EPI  LIV  2 Term 2003 [redacted]w[redacted]d 08:00 8 lb 10 oz (3.912 kg) F Vag-Spont EPI  LIV  1 Term 1999 [redacted]w[redacted]d 06:00 9 lb 15 oz (4.508 kg) M Vag-Spont EPI  LIV       08/06/2022    9:11 AM 06/04/2022    4:23 PM 05/22/2022   11:21 AM  Depression screen PHQ 2/9  Decreased Interest 0 0 0  Down, Depressed, Hopeless 0 0 0  PHQ - 2 Score 0 0 0     The following portions of the patient's history were reviewed and updated as appropriate: allergies, current medications, past family history, past medical history, past social history, past surgical history, and problem list.  ROS  Past medical history, past surgical history, family history and social history were all reviewed and documented in the EPIC chart.  Exam:  Vitals:   12/04/23 1053  BP: 112/74  Weight: 229 lb (103.9 kg)   Body mass index is 36.41 kg/m.  Physical Exam Exam conducted with a chaperone present.  Constitutional:      Appearance: Normal appearance. She is obese.  Pulmonary:     Effort: Pulmonary effort is normal.  Genitourinary:    General: Normal vulva.     Vagina: Vaginal discharge present. No prolapsed vaginal walls (no prolapse, poor pelvic tone).     Cervix:  Normal.     Uterus: Normal.      Adnexa: Right adnexa normal and left adnexa normal.  Neurological:     Mental Status: She is alert.  Psychiatric:        Mood and Affect: Mood normal.        Thought Content: Thought content normal.        Judgment: Judgment normal.      Ashley Lane, CMA present for exam  Assessment/Plan:   1. Amenorrhea (Primary) - Pregnancy, urine; negative - Likely perimenopause, will continue further discussion at next visit  2. Perimenopausal  3. Urinary frequency - Urinalysis,Complete w/RFL Culture  4. Vaginal discharge - WET PREP FOR TRICH, YEAST, CLUE  5. Stress incontinence in female - Ambulatory referral to Pelvic floor Physical Therapy  6. LLQ pain - US  PELVIS TRANSVAGINAL NON-OB (TV ONLY); Future    Return for u/s only/ f/u with JC another day.  Ashley Lane B WHNP-BC 11:27 AM 12/04/2023

## 2023-12-07 NOTE — Progress Notes (Deleted)
   ANNUAL EXAM Patient name: Ashley Lane MRN [id number]  Date of birth: 09/20/1975 Chief Complaint:   No chief complaint on file.  History of Present Illness:   Ashley Lane is a 48 y.o. G88P3003 Caucasian female being seen today for a routine annual exam.  Current complaints: ***  Patient's last menstrual period was 11/11/2023 (approximate).   The pregnancy intention screening data noted above was reviewed. Potential methods of contraception were discussed. The patient elected to proceed with No data recorded.   Last pap ***. Results were: {Pap findings:25134}. H/O abnormal pap: {yes/yes***/no:23866} Last mammogram: ***. Results were: {normal, abnormal, n/a:23837}. Family h/o breast cancer: {yes***/no:23838} Last colonoscopy: ***. Results were: {normal, abnormal, n/a:23837}. Family h/o colorectal cancer: {yes***/no:23838}     08/06/2022    9:11 AM 06/04/2022    4:23 PM 05/22/2022   11:21 AM 02/12/2022   10:44 AM  Depression screen PHQ 2/9  Decreased Interest 0 0 0 0  Down, Depressed, Hopeless 0 0 0 0  PHQ - 2 Score 0 0 0 0         No data to display           Review of Systems:   Pertinent items are noted in HPI Denies any headaches, blurred vision, fatigue, shortness of breath, chest pain, abdominal pain, abnormal vaginal discharge/itching/odor/irritation, problems with periods, bowel movements, urination, or intercourse unless otherwise stated above. Pertinent History Reviewed:  Reviewed past medical,surgical, social and family history.  Reviewed problem list, medications and allergies. Physical Assessment:  There were no vitals filed for this visit.There is no height or weight on file to calculate BMI.        Physical Examination:   General appearance - well appearing, and in no distress  Mental status - alert, oriented to person, place, and time  Psych:  She has a normal mood and affect  Skin - warm and dry, normal color, no suspicious lesions  noted  Chest - effort normal, all lung fields clear to auscultation bilaterally  Heart - normal rate and regular rhythm  Neck:  midline trachea, no thyromegaly or nodules  Breasts - breasts appear normal, no suspicious masses, no skin or nipple changes or  axillary nodes  Abdomen - soft, nontender, nondistended, no masses or organomegaly  Pelvic - VULVA: normal appearing vulva with no masses, tenderness or lesions  VAGINA: normal appearing vagina with normal color and discharge, no lesions  CERVIX: normal appearing cervix without discharge or lesions, no CMT  Thin prep pap is {Desc; done/not:10129} *** HR HPV cotesting  UTERUS: uterus is felt to be normal size, shape, consistency and nontender   ADNEXA: No adnexal masses or tenderness noted.  Rectal - normal rectal, good sphincter tone, no masses felt. Hemoccult: ***  Extremities:  No swelling or varicosities noted  Chaperone present for exam  No results found for this or any previous visit (from the past 24 hours).  Assessment & Plan:  1) Well-Woman Exam  2) ***  Labs/procedures today: ***  Mammogram: {Mammo f/u:25212::@ 48yo}, or sooner if problems Colonoscopy: {TCS f/u:25213::@ 48yo}, or sooner if problems  No orders of the defined types were placed in this encounter.   Meds: No orders of the defined types were placed in this encounter.   Follow-up: No follow-ups on file.  Juniper Cobey E, RN 12/07/2023 5:01 PM

## 2023-12-08 ENCOUNTER — Ambulatory Visit (HOSPITAL_BASED_OUTPATIENT_CLINIC_OR_DEPARTMENT_OTHER): Admitting: Obstetrics & Gynecology

## 2023-12-08 ENCOUNTER — Telehealth: Payer: Self-pay

## 2023-12-08 DIAGNOSIS — R350 Frequency of micturition: Secondary | ICD-10-CM

## 2023-12-08 DIAGNOSIS — N393 Stress incontinence (female) (male): Secondary | ICD-10-CM

## 2023-12-08 NOTE — Telephone Encounter (Signed)
 Spoke with Dr. Glennon, she states that as long as the pt is comfortable doing the ultrasound while on her period she can still have it done.    I Have let pt aware of this, she did want to know about the results for the UA from last week.   Please advise

## 2023-12-08 NOTE — Telephone Encounter (Signed)
 Pt called in stating that she is scheduled for a PUS tomorrow. She started her period yesterday. She states that she is having moderate bleeding. She wanted to know if she can still come in or should she rs appt.   She would also like to know the results from the urine culture.   Please advise.

## 2023-12-09 ENCOUNTER — Ambulatory Visit: Payer: Self-pay | Admitting: Radiology

## 2023-12-09 ENCOUNTER — Other Ambulatory Visit

## 2023-12-09 ENCOUNTER — Other Ambulatory Visit (INDEPENDENT_AMBULATORY_CARE_PROVIDER_SITE_OTHER)

## 2023-12-09 ENCOUNTER — Other Ambulatory Visit: Payer: Self-pay | Admitting: Radiology

## 2023-12-09 DIAGNOSIS — R1032 Left lower quadrant pain: Secondary | ICD-10-CM

## 2023-12-09 NOTE — Telephone Encounter (Signed)
 Spoke with patient. Patient is en route to [facility] for PUS, will plan to repeat UA and culture this morning as previously discussed, when she arrives to the office. Advised I will update Jami as well. Patient agreeable.   Routing to provider for final review. Patient is agreeable to disposition. Will close encounter.

## 2023-12-09 NOTE — Telephone Encounter (Signed)
 Was advised Friday by Kate Africa re: urine sample. May leave another when she comes for u/s.

## 2023-12-09 NOTE — Addendum Note (Signed)
 Addended by: BRUTUS KATE SAILOR on: 12/09/2023 08:19 AM   Modules accepted: Orders

## 2023-12-10 ENCOUNTER — Ambulatory Visit: Payer: Self-pay | Admitting: Radiology

## 2023-12-10 LAB — URINALYSIS, ROUTINE W REFLEX MICROSCOPIC
Bacteria, UA: NONE SEEN /HPF
Glucose, UA: NEGATIVE
Hyaline Cast: NONE SEEN /LPF
Leukocytes,Ua: NEGATIVE
Nitrite: NEGATIVE
Specific Gravity, Urine: 1.031 (ref 1.001–1.035)
pH: 5.5 (ref 5.0–8.0)

## 2023-12-10 LAB — URINE CULTURE
MICRO NUMBER:: 16980279
Result:: NO GROWTH
SPECIMEN QUALITY:: ADEQUATE

## 2023-12-10 LAB — NOTE

## 2023-12-15 NOTE — Progress Notes (Signed)
Discussed with pt during OV

## 2023-12-15 NOTE — Progress Notes (Signed)
 Assessment and Plan   1. Well adult exam (Primary) -     Hemoglobin A1c; Future; Expected date: 12/15/2023 -     Lipid panel ([facility]); Future; Expected date: 12/15/2023 -     CBC And Differential; Future; Expected date: 12/15/2023 -     Comprehensive Metabolic Panel; Future; Expected date: 12/15/2023 -     TSH; Future; Expected date: 12/15/2023 2. Bilirubin in urine Comments: negative today Orders: -     POCT urinalysis dipstick 3. Type 2 diabetes mellitus without complication, without long-term current use of insulin (*) -     Hemoglobin A1c; Future; Expected date: 12/15/2023 -     Comprehensive Metabolic Panel; Future; Expected date: 12/15/2023 -     Albumin/Creatinine Ratio, Random Urine; Future 4. Low ferritin 5. Obstructive sleep apnea syndrome, mild 6. Severe obesity (BMI 35.0-39.9) with comorbidity (*) 7. Thyroid  disorder screen -     TSH; Future; Expected date: 12/15/2023 8. Lipid screening -     Lipid panel ([facility]); Future; Expected date: 12/15/2023 9. Screening for disorder of blood and blood-forming organs -     CBC And Differential; Future; Expected date: 12/15/2023 10. State of stress    - Physical activity: Current physical activity level is appropriate. Provided information on continuing current level of physical activity.  - Folic acid supplementation: Provided information about daily supplementation with 400-800 mcg of folic acid for the prevention of neural tube defects.  - Unhealthy alcohol use screening: No unhealthy alcohol use identified.     - Colon cancer screening: up-to-date with colon cancer screening          - Breast cancer screening: Mammography up-to-date. Repeat in 1 year.             - Cervical cancer screening: up-to-date with cervical cancer screening               - STI screening: STI screening was not indicated today. Information about STI screening provided on AVS.        - Hypertension screening: Blood  pressure of 108/74 is well controlled.         - Diabetes screening: She has an existing diagnosis of diabetes mellitus.        - Intimate partner violence screening: Negative intimate partner violence screen.         - Statins for ASCVD risk reduction: A fasting lipid panel was ordered today  - HIV pre-exposure prophylaxis: Not indicated        -diabetes is managed by diet and berberine   Will check A1c today    will follow every 6 months   She will see endocrine the other 6 months so her A1c will be done every 3 months  -history of iron deficiency due to heavy periods   She is now perimenopausal so this should help her iron deficiency  - bilirubin in her urine   negative today  Office Visit on 12/15/2023  Component Date Value Ref Range Status  . Color 12/15/2023 Yellow  Yellow, Straw, Dark yellow, Colorless, Bright yellow, Xantho, Comment, Pale Yellow Final  . Clarity 12/15/2023 Clear  Clear, Slightly hazy Final  . Glucose,Urine 12/15/2023 Negative  Negative mg/dL Final  . Bilirubin 90/76/7974 Negative  Negative Final  . Ketones,Urine 12/15/2023 Negative  Negative mg/dL Final  . Specific Gravity 12/15/2023 1.020  1.005 - 1.030 Final  . Blood, Urine 12/15/2023 Negative  Negative Final  . pH 12/15/2023 6.0  5.0 - 9.0 Final  . Protein 12/15/2023  Negative  Negative mg/dL Final  . Urobilinogen 90/76/7974 0.2  0.2, 1.0 EU/dL Final  . Nitrite 90/76/7974 Negative  Negative Final  . Leukocyte Esterase 12/15/2023 Negative  Negative Final    - All other chronic conditions are stable and she will continue taking all other medications as prescribed.  - Discussed the patient's BMI. The BMI is above average; BMI management plan is completed.  Stressed importance of moderation in sodium/caffeine intake, saturated fat and cholesterol, caloric balance, sufficient intake of fresh fruits, vegetables, fiber, calcium , iron, and 1 mg of folate supplement per day (for females capable of pregnancy). Stressed the  importance of regular exercise. - Labs ordered today: cmp, cbc/d, lipid, tsh, A1c. Will call pt with the results. - If conditions worsen or fail to improve with conservative management, she was advised to contact the office or report to the emergency department as needed.     Follow up in about 6 months (around 06/13/2024) for diabetes and cholesterol.    Risks, benefits, and alternatives of the medications and treatment plan prescribed today were discussed, and patient expressed understanding. Plan follow-up as discussed or as needed if any worsening symptoms or change in condition.     Subjective      Patient presents with  . Advice Only    Patient went to GYN twice and had Bilirubin in urine   . Annual Exam     Additional issues addressed beyond the scope of the wellness visit: none  Patient history from pre-visit questionnaire Is there anything specific you would like to address with your healthcare provider today?: bilirubin in urine Do you intend to get your routine gynecologic care (Pap tests, birth control, etc) at our office or an OB/GYN office?: OB/GYN office (GYN) Have you been sexually active since your last wellness visit (physical)?: Yes - Men Have you had a new sexual partner since your last wellenss visit (physical)?: No Do you live with or have sex with a person with hepatitis B (risk factors for hepatitis B)?: No Did you have a blood transfusion before 1992?: No Do you ever use illegal injection drugs (other than medications your doctor has prescribed like insulin and vaccines).: No Were you born in [place], [place], [place], [place], [place] or [place] (countries with high rates of tuberculosis)?: No In the last year, have you lived in a long-term care facility (nursing home), correctional facility or were homeless?: No Do you wear a seatbelt when you ride in a car?: Always   Reviewed and updated this visit by provider: Tobacco  Allergies  Meds   Problems  Med Hx  Surg Hx  Fam Hx      Review of systems: Constitutional: (-) Fatigue, (-) Unexpected weight change, (-) Fever, (-) Appetite change ENT:  (-) Tinnitus, (-) Trouble swallowing Eyes: (-) Photophobia, (-) Visual disturbance Lungs: (-) Cough, (-) Shortness of breath, (-) Wheezing,(-) Hemoptysis Heart: (-) Chest pain, (-) Leg swelling, (-) Palpitations Gastrointestinal: (-) Abdominal pain, (-) Nausea,  (-) Constipation, (-) Diarrhea,  (-) Blood in stool Endocrine: (-) Polydipsia (-) Polyuria Genitourinary: (-) Urinary urgency,  (-) Dyspareunia,  (-) Difficulty urinating Musculoskeletal: (-) Arthralgia,  (-) Myalgia Skin: (-) Rash,  (-) Skin color change,  (-) Wound Allergy: (-) Environmental allergies (-) Food allergies Neurologic: (-) Weakness, (-) Numbness,  (-) Sleep disturbance, (-) Headache Hematology: (-) Adenopathy,  (-) Easy bruising or bleeding Psychiatric: (-) Dysphoria,  (-) Anxious Objective   Vitals:   12/15/23 0948  BP: 108/74  Patient Position: Sitting  Pulse: 80  Temp: 97.8 F (36.6 C)  TempSrc: Oral  Resp: 16  Height: 5' 6 (1.676 m)  Weight: 229 lb 6.4 oz (104.1 kg)  SpO2: 98%  BMI (Calculated): 37  PainSc: 0-No pain    Physical Exam Vitals and nursing note reviewed.  Constitutional:      General: She is not in acute distress.    Appearance: Normal appearance. She is well-developed. She is not ill-appearing, toxic-appearing or diaphoretic.  HENT:     Head: Normocephalic and atraumatic.     Right Ear: External ear normal.     Left Ear: External ear normal.     Nose: Nose normal.  Eyes:     Pupils: Pupils are equal, round, and reactive to light.  Neck:     Thyroid : No thyromegaly.     Vascular: No carotid bruit.     Trachea: No tracheal deviation.  Cardiovascular:     Rate and Rhythm: Normal rate and regular rhythm.     Heart sounds: Normal heart sounds. No murmur heard.    No friction rub. No gallop.  Musculoskeletal:      Cervical back: Normal range of motion and neck supple.  Pulmonary:     Effort: Pulmonary effort is normal. No respiratory distress.     Breath sounds: Normal breath sounds. No wheezing.  Abdominal:     General: Bowel sounds are normal. There is no distension.     Palpations: Abdomen is soft. There is no mass.     Tenderness: There is no abdominal tenderness. There is no guarding or rebound.     Hernia: No hernia is present.  Lymphadenopathy:     Cervical: No cervical adenopathy.  Skin:    General: Skin is warm and dry.     Coloration: Skin is not pale.     Findings: No erythema or rash.  Neurological:     Mental Status: She is alert and oriented to person, place, and time.     Gait: Gait normal.     Deep Tendon Reflexes: Reflexes are normal and symmetric.  Psychiatric:        Mood and Affect: Mood normal.        Behavior: Behavior normal.        Thought Content: Thought content normal.        Judgment: Judgment normal.    Diabetic foot exam:  Left: Monofilament test: Sensation normal  Pulses: normal and present  Skin: Normal and no erythema, no cyanosis or pallor   Other findings: none Right: Monofilament test: Sensation normal  Pulses: normal and present  Skin: Normal and no erythema, no cyanosis or pallor   Other findings: none Exam performed with shoes and socks removed.  SDoH Screening Completed SDoH screening was reviewed as documented with appropriate diagnoses added based on screening.  Time spent performing SDoH screening, documenting as appropriate, and entering intervention when necessary: 5 minutes or more

## 2023-12-17 NOTE — Progress Notes (Signed)
 Needs appt to discuss results and treatment   plan    Berberine is not enough to control her sugars    Cholesterol is high enough that I am worried about a  heart attack or stroke

## 2023-12-19 ENCOUNTER — Encounter (HOSPITAL_COMMUNITY): Payer: Self-pay

## 2023-12-19 ENCOUNTER — Emergency Department (HOSPITAL_COMMUNITY)

## 2023-12-19 ENCOUNTER — Other Ambulatory Visit: Payer: Self-pay

## 2023-12-19 ENCOUNTER — Emergency Department (HOSPITAL_COMMUNITY)
Admission: EM | Admit: 2023-12-19 | Discharge: 2023-12-19 | Discharge status: Against Medical Advice | Attending: Emergency Medicine | Admitting: Emergency Medicine

## 2023-12-19 DIAGNOSIS — R079 Chest pain, unspecified: Secondary | ICD-10-CM | POA: Diagnosis present

## 2023-12-19 DIAGNOSIS — Z5321 Procedure and treatment not carried out due to patient leaving prior to being seen by health care provider: Secondary | ICD-10-CM | POA: Diagnosis not present

## 2023-12-19 DIAGNOSIS — R002 Palpitations: Secondary | ICD-10-CM | POA: Diagnosis not present

## 2023-12-19 DIAGNOSIS — R202 Paresthesia of skin: Secondary | ICD-10-CM | POA: Insufficient documentation

## 2023-12-19 LAB — CBC
HCT: 37 % (ref 36.0–46.0)
Hemoglobin: 12.3 g/dL (ref 12.0–15.0)
MCH: 28.5 pg (ref 26.0–34.0)
MCHC: 33.2 g/dL (ref 30.0–36.0)
MCV: 85.6 fL (ref 80.0–100.0)
Platelets: 413 K/uL — ABNORMAL HIGH (ref 150–400)
RBC: 4.32 MIL/uL (ref 3.87–5.11)
RDW: 12 % (ref 11.5–15.5)
WBC: 7.8 K/uL (ref 4.0–10.5)
nRBC: 0 % (ref 0.0–0.2)

## 2023-12-19 LAB — BASIC METABOLIC PANEL WITH GFR
Anion gap: 14 (ref 5–15)
BUN: 13 mg/dL (ref 6–20)
CO2: 21 mmol/L — ABNORMAL LOW (ref 22–32)
Calcium: 9.1 mg/dL (ref 8.9–10.3)
Chloride: 101 mmol/L (ref 98–111)
Creatinine, Ser: 0.69 mg/dL (ref 0.44–1.00)
GFR, Estimated: >60 mL/min (ref >60)
Glucose, Bld: 188 mg/dL — ABNORMAL HIGH (ref 70–99)
Potassium: 3.7 mmol/L (ref 3.5–5.1)
Sodium: 136 mmol/L (ref 135–145)

## 2023-12-19 LAB — TROPONIN I (HIGH SENSITIVITY): Troponin I (High Sensitivity): 3 ng/L (ref <18)

## 2023-12-19 LAB — HCG, SERUM, QUALITATIVE: Preg, Serum: NEGATIVE

## 2023-12-19 NOTE — ED Notes (Signed)
 Patient decided to leave due to long wait.

## 2023-12-19 NOTE — ED Triage Notes (Signed)
 Pt states over the last two days she has been having bad palpitations and feelings of her heart racing. She mentions it has been worse tonight and that it has kept her from sleeping. She is having tingling in her left arm but no active chest pain with the palpitations, she does mentions that it is hard to catch her breath sometimes. Did recently go to her pcp for lab work and after having some abnormal test results she has had this current feeling of palpitations. No fever/cough/congestion.

## 2024-04-04 ENCOUNTER — Encounter: Payer: Self-pay | Admitting: *Deleted
